# Patient Record
Sex: Male | Born: 1990 | Race: Black or African American | Hispanic: No | Marital: Single | Smoking: Current every day smoker
Health system: Southern US, Community
[De-identification: ages and names within clinical notes are randomized; demographics above are authoritative.]

## PROBLEM LIST (undated history)

## (undated) DIAGNOSIS — E119 Type 2 diabetes mellitus without complications: Secondary | ICD-10-CM

---
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
## 2013-06-15 ENCOUNTER — Emergency Department: Payer: Self-pay | Admitting: Emergency Medicine
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
## 2014-11-06 ENCOUNTER — Emergency Department: Payer: Self-pay | Admitting: Emergency Medicine

## 2014-12-15 ENCOUNTER — Emergency Department: Payer: Self-pay | Admitting: Emergency Medicine

## 2014-12-15 LAB — CBC
HCT: 46.2 % (ref 40.0–52.0)
HGB: 15.4 g/dL (ref 13.0–18.0)
MCH: 30.5 pg (ref 26.0–34.0)
MCHC: 33.4 g/dL (ref 32.0–36.0)
MCV: 91 fL (ref 80–100)
PLATELETS: 198 10*3/uL (ref 150–440)
RBC: 5.06 10*6/uL (ref 4.40–5.90)
RDW: 13.1 % (ref 11.5–14.5)
WBC: 5.5 10*3/uL (ref 3.8–10.6)

## 2014-12-15 LAB — URINALYSIS, COMPLETE
Bacteria: NONE SEEN
Bilirubin,UR: NEGATIVE
Blood: NEGATIVE
Glucose,UR: >=500 mg/dL (ref 0–75)
Leukocyte Esterase: NEGATIVE
NITRITE: NEGATIVE
PH: 5 (ref 4.5–8.0)
Protein: NEGATIVE
RBC,UR: NONE SEEN /HPF (ref 0–5)
Specific Gravity: 1.036 (ref 1.003–1.030)
Squamous Epithelial: NONE SEEN
WBC UR: <1 /HPF (ref 0–5)

## 2014-12-15 LAB — COMPREHENSIVE METABOLIC PANEL
Albumin: 3.9 g/dL (ref 3.4–5.0)
Alkaline Phosphatase: 81 U/L (ref 46–116)
Anion Gap: 9 (ref 7–16)
BUN: 9 mg/dL (ref 7–18)
Bilirubin,Total: 0.7 mg/dL (ref 0.2–1.0)
CHLORIDE: 100 mmol/L (ref 98–107)
CO2: 27 mmol/L (ref 21–32)
CREATININE: 0.68 mg/dL (ref 0.60–1.30)
Calcium, Total: 8.9 mg/dL (ref 8.5–10.1)
Glucose: 225 mg/dL — ABNORMAL HIGH (ref 65–99)
OSMOLALITY: 278 (ref 275–301)
Potassium: 4.4 mmol/L (ref 3.5–5.1)
SGOT(AST): 24 U/L (ref 15–37)
SGPT (ALT): 22 U/L (ref 14–63)
SODIUM: 136 mmol/L (ref 136–145)
Total Protein: 6.9 g/dL (ref 6.4–8.2)

## 2014-12-15 LAB — TROPONIN I: Troponin-I: <0.02 ng/mL

## 2014-12-16 LAB — HEMOGLOBIN A1C

## 2023-03-15 ENCOUNTER — Encounter: Payer: Self-pay | Admitting: Radiology

## 2023-03-15 ENCOUNTER — Emergency Department: Payer: 59

## 2023-03-15 ENCOUNTER — Other Ambulatory Visit: Payer: Self-pay

## 2023-03-15 DIAGNOSIS — E1110 Type 2 diabetes mellitus with ketoacidosis without coma: Secondary | ICD-10-CM | POA: Diagnosis not present

## 2023-03-15 DIAGNOSIS — R112 Nausea with vomiting, unspecified: Secondary | ICD-10-CM | POA: Diagnosis present

## 2023-03-15 DIAGNOSIS — E878 Other disorders of electrolyte and fluid balance, not elsewhere classified: Secondary | ICD-10-CM | POA: Diagnosis present

## 2023-03-15 DIAGNOSIS — R9431 Abnormal electrocardiogram [ECG] [EKG]: Secondary | ICD-10-CM | POA: Diagnosis present

## 2023-03-15 DIAGNOSIS — R197 Diarrhea, unspecified: Secondary | ICD-10-CM | POA: Diagnosis not present

## 2023-03-15 DIAGNOSIS — Y92009 Unspecified place in unspecified non-institutional (private) residence as the place of occurrence of the external cause: Secondary | ICD-10-CM

## 2023-03-15 DIAGNOSIS — N179 Acute kidney failure, unspecified: Secondary | ICD-10-CM | POA: Insufficient documentation

## 2023-03-15 DIAGNOSIS — E860 Dehydration: Secondary | ICD-10-CM | POA: Diagnosis present

## 2023-03-15 DIAGNOSIS — Z91148 Patient's other noncompliance with medication regimen for other reason: Secondary | ICD-10-CM

## 2023-03-15 DIAGNOSIS — T383X6A Underdosing of insulin and oral hypoglycemic [antidiabetic] drugs, initial encounter: Secondary | ICD-10-CM | POA: Diagnosis present

## 2023-03-15 LAB — COMPREHENSIVE METABOLIC PANEL
ALT: 22 U/L (ref 0–44)
AST: 29 U/L (ref 15–41)
Albumin: 5.2 g/dL — ABNORMAL HIGH (ref 3.5–5.0)
Alkaline Phosphatase: 72 U/L (ref 38–126)
Anion gap: 31 — ABNORMAL HIGH (ref 5–15)
BUN: 22 mg/dL — ABNORMAL HIGH (ref 6–20)
CO2: 13 mmol/L — ABNORMAL LOW (ref 22–32)
Calcium: 10.6 mg/dL — ABNORMAL HIGH (ref 8.9–10.3)
Chloride: 91 mmol/L — ABNORMAL LOW (ref 98–111)
Creatinine, Ser: 1.27 mg/dL — ABNORMAL HIGH (ref 0.61–1.24)
GFR, Estimated: >60 mL/min (ref >60)
Glucose, Bld: 410 mg/dL — ABNORMAL HIGH (ref 70–99)
Potassium: 3.6 mmol/L (ref 3.5–5.1)
Sodium: 135 mmol/L (ref 135–145)
Total Bilirubin: 2.1 mg/dL — ABNORMAL HIGH (ref 0.3–1.2)
Total Protein: 9.4 g/dL — ABNORMAL HIGH (ref 6.5–8.1)

## 2023-03-15 LAB — CBC
HCT: 51.1 % (ref 39.0–52.0)
Hemoglobin: 17.7 g/dL — ABNORMAL HIGH (ref 13.0–17.0)
MCH: 31.1 pg (ref 26.0–34.0)
MCHC: 34.6 g/dL (ref 30.0–36.0)
MCV: 89.8 fL (ref 80.0–100.0)
Platelets: 407 10*3/uL — ABNORMAL HIGH (ref 150–400)
RBC: 5.69 MIL/uL (ref 4.22–5.81)
RDW: 11.9 % (ref 11.5–15.5)
WBC: 14.1 10*3/uL — ABNORMAL HIGH (ref 4.0–10.5)
nRBC: 0 % (ref 0.0–0.2)

## 2023-03-15 LAB — LIPASE, BLOOD: Lipase: 23 U/L (ref 11–51)

## 2023-03-15 MED ORDER — ONDANSETRON 4 MG PO TBDP
4.0000 mg | ORAL_TABLET | Freq: Once | ORAL | Status: AC | PRN
  Administered 2023-03-15: 4 mg via ORAL
  Filled 2023-03-15: qty 1

## 2023-03-15 NOTE — ED Triage Notes (Signed)
Pt states he has been throwing up for the past 12 hours. Unable to keep anything down including water.Pt endorses loose stool x 2 but then just vomiting.

## 2023-03-16 ENCOUNTER — Observation Stay
Admission: EM | Admit: 2023-03-16 | Discharge: 2023-03-17 | Disposition: A | Discharge status: Home / Self Care | Payer: 59 | Attending: Internal Medicine | Admitting: Internal Medicine

## 2023-03-16 ENCOUNTER — Other Ambulatory Visit: Payer: Self-pay

## 2023-03-16 DIAGNOSIS — R1114 Bilious vomiting: Secondary | ICD-10-CM

## 2023-03-16 DIAGNOSIS — T383X6A Underdosing of insulin and oral hypoglycemic [antidiabetic] drugs, initial encounter: Secondary | ICD-10-CM | POA: Diagnosis present

## 2023-03-16 DIAGNOSIS — R112 Nausea with vomiting, unspecified: Principal | ICD-10-CM | POA: Diagnosis present

## 2023-03-16 DIAGNOSIS — R9431 Abnormal electrocardiogram [ECG] [EKG]: Secondary | ICD-10-CM | POA: Diagnosis present

## 2023-03-16 DIAGNOSIS — E1110 Type 2 diabetes mellitus with ketoacidosis without coma: Secondary | ICD-10-CM | POA: Diagnosis present

## 2023-03-16 DIAGNOSIS — E1310 Other specified diabetes mellitus with ketoacidosis without coma: Secondary | ICD-10-CM

## 2023-03-16 DIAGNOSIS — N179 Acute kidney failure, unspecified: Secondary | ICD-10-CM | POA: Diagnosis present

## 2023-03-16 DIAGNOSIS — E878 Other disorders of electrolyte and fluid balance, not elsewhere classified: Secondary | ICD-10-CM | POA: Diagnosis present

## 2023-03-16 DIAGNOSIS — E860 Dehydration: Secondary | ICD-10-CM | POA: Diagnosis present

## 2023-03-16 DIAGNOSIS — Y92009 Unspecified place in unspecified non-institutional (private) residence as the place of occurrence of the external cause: Secondary | ICD-10-CM | POA: Diagnosis not present

## 2023-03-16 DIAGNOSIS — Z91148 Patient's other noncompliance with medication regimen for other reason: Secondary | ICD-10-CM | POA: Diagnosis not present

## 2023-03-16 HISTORY — DX: Type 2 diabetes mellitus without complications: E11.9

## 2023-03-16 LAB — BASIC METABOLIC PANEL
Anion gap: 19 — ABNORMAL HIGH (ref 5–15)
Anion gap: 14 (ref 5–15)
Anion gap: 10 (ref 5–15)
BUN: 25 mg/dL — ABNORMAL HIGH (ref 6–20)
BUN: 19 mg/dL (ref 6–20)
BUN: 21 mg/dL — ABNORMAL HIGH (ref 6–20)
CO2: 21 mmol/L — ABNORMAL LOW (ref 22–32)
CO2: 25 mmol/L (ref 22–32)
CO2: 26 mmol/L (ref 22–32)
Calcium: 10.1 mg/dL (ref 8.9–10.3)
Calcium: 9.9 mg/dL (ref 8.9–10.3)
Calcium: 9.2 mg/dL (ref 8.9–10.3)
Chloride: 93 mmol/L — ABNORMAL LOW (ref 98–111)
Chloride: 96 mmol/L — ABNORMAL LOW (ref 98–111)
Chloride: 100 mmol/L (ref 98–111)
Creatinine, Ser: 0.97 mg/dL (ref 0.61–1.24)
Creatinine, Ser: 1.04 mg/dL (ref 0.61–1.24)
Creatinine, Ser: 0.7 mg/dL (ref 0.61–1.24)
GFR, Estimated: >60 mL/min (ref >60)
GFR, Estimated: >60 mL/min (ref >60)
GFR, Estimated: >60 mL/min (ref >60)
Glucose, Bld: 263 mg/dL — ABNORMAL HIGH (ref 70–99)
Glucose, Bld: 230 mg/dL — ABNORMAL HIGH (ref 70–99)
Glucose, Bld: 174 mg/dL — ABNORMAL HIGH (ref 70–99)
Potassium: 3.1 mmol/L — ABNORMAL LOW (ref 3.5–5.1)
Potassium: 3.5 mmol/L (ref 3.5–5.1)
Potassium: 3.6 mmol/L (ref 3.5–5.1)
Sodium: 133 mmol/L — ABNORMAL LOW (ref 135–145)
Sodium: 135 mmol/L (ref 135–145)
Sodium: 136 mmol/L (ref 135–145)

## 2023-03-16 LAB — LIPID PANEL
Cholesterol: 249 mg/dL — ABNORMAL HIGH (ref 0–200)
HDL: 73 mg/dL (ref >40)
LDL Cholesterol: 147 mg/dL — ABNORMAL HIGH (ref 0–99)
Total CHOL/HDL Ratio: 3.4 RATIO
Triglycerides: 143 mg/dL (ref <150)
VLDL: 29 mg/dL (ref 0–40)

## 2023-03-16 LAB — GLUCOSE, CAPILLARY
Glucose-Capillary: 377 mg/dL — ABNORMAL HIGH (ref 70–99)
Glucose-Capillary: 292 mg/dL — ABNORMAL HIGH (ref 70–99)
Glucose-Capillary: 262 mg/dL — ABNORMAL HIGH (ref 70–99)
Glucose-Capillary: 208 mg/dL — ABNORMAL HIGH (ref 70–99)
Glucose-Capillary: 209 mg/dL — ABNORMAL HIGH (ref 70–99)
Glucose-Capillary: 226 mg/dL — ABNORMAL HIGH (ref 70–99)
Glucose-Capillary: 211 mg/dL — ABNORMAL HIGH (ref 70–99)
Glucose-Capillary: 182 mg/dL — ABNORMAL HIGH (ref 70–99)
Glucose-Capillary: 203 mg/dL — ABNORMAL HIGH (ref 70–99)
Glucose-Capillary: 142 mg/dL — ABNORMAL HIGH (ref 70–99)
Glucose-Capillary: 179 mg/dL — ABNORMAL HIGH (ref 70–99)
Glucose-Capillary: 169 mg/dL — ABNORMAL HIGH (ref 70–99)
Glucose-Capillary: 134 mg/dL — ABNORMAL HIGH (ref 70–99)
Glucose-Capillary: 163 mg/dL — ABNORMAL HIGH (ref 70–99)

## 2023-03-16 LAB — BETA-HYDROXYBUTYRIC ACID
Beta-Hydroxybutyric Acid: 3.69 mmol/L — ABNORMAL HIGH (ref 0.05–0.27)
Beta-Hydroxybutyric Acid: 2.44 mmol/L — ABNORMAL HIGH (ref 0.05–0.27)
Beta-Hydroxybutyric Acid: 0.85 mmol/L — ABNORMAL HIGH (ref 0.05–0.27)

## 2023-03-16 LAB — HIV ANTIBODY (ROUTINE TESTING W REFLEX): HIV Screen 4th Generation wRfx: NONREACTIVE

## 2023-03-16 LAB — BLOOD GAS, VENOUS
Acid-Base Excess: 0.9 mmol/L (ref 0.0–2.0)
Bicarbonate: 23.8 mmol/L (ref 20.0–28.0)
O2 Saturation: 93.3 %
Patient temperature: 37
pCO2, Ven: 32 mmHg — ABNORMAL LOW (ref 44–60)
pH, Ven: 7.48 — ABNORMAL HIGH (ref 7.25–7.43)
pO2, Ven: 58 mmHg — ABNORMAL HIGH (ref 32–45)

## 2023-03-16 LAB — CK: Total CK: 79 U/L (ref 49–397)

## 2023-03-16 LAB — CBG MONITORING, ED: Glucose-Capillary: >600 mg/dL (ref 70–99)

## 2023-03-16 LAB — MAGNESIUM: Magnesium: 2.1 mg/dL (ref 1.7–2.4)

## 2023-03-16 MED ORDER — HEPARIN SODIUM (PORCINE) 5000 UNIT/ML IJ SOLN
5000.0000 [IU] | Freq: Three times a day (TID) | INTRAMUSCULAR | Status: DC
  Administered 2023-03-16 – 2023-03-17 (×4): 5000 [IU] via SUBCUTANEOUS
  Filled 2023-03-16 (×4): qty 1

## 2023-03-16 MED ORDER — SODIUM CHLORIDE 0.9 % IV SOLN: 12.5000 mg | Freq: Four times a day (QID) | INTRAVENOUS | Status: DC | PRN

## 2023-03-16 MED ORDER — LACTATED RINGERS IV SOLN: INTRAVENOUS | Status: DC

## 2023-03-16 MED ORDER — SODIUM CHLORIDE 0.9 % IV BOLUS: 1000.0000 mL | Freq: Once | INTRAVENOUS | Status: DC

## 2023-03-16 MED ORDER — DEXTROSE-NACL 5-0.45 % IV SOLN: INTRAVENOUS | Status: DC

## 2023-03-16 MED ORDER — SODIUM CHLORIDE 0.9 % IV BOLUS
2000.0000 mL | Freq: Once | INTRAVENOUS | Status: AC
  Administered 2023-03-16: 2000 mL via INTRAVENOUS

## 2023-03-16 MED ORDER — INSULIN REGULAR(HUMAN) IN NACL 100-0.9 UT/100ML-% IV SOLN
INTRAVENOUS | Status: DC
  Filled 2023-03-16: qty 100

## 2023-03-16 MED ORDER — POTASSIUM CHLORIDE 10 MEQ/100ML IV SOLN
10.0000 meq | INTRAVENOUS | Status: AC
  Administered 2023-03-16 (×6): 10 meq via INTRAVENOUS
  Filled 2023-03-16 (×6): qty 100

## 2023-03-16 MED ORDER — DEXTROSE 50 % IV SOLN: 0.0000 mL | INTRAVENOUS | Status: DC | PRN

## 2023-03-16 MED ORDER — SODIUM CHLORIDE 0.9 % IV SOLN
INTRAVENOUS | Status: DC
  Filled 2023-03-16 (×2): qty 1000

## 2023-03-16 MED ORDER — DEXTROSE IN LACTATED RINGERS 5 % IV SOLN: INTRAVENOUS | Status: DC

## 2023-03-16 MED ORDER — ONDANSETRON HCL 4 MG/2ML IJ SOLN
4.0000 mg | Freq: Four times a day (QID) | INTRAMUSCULAR | Status: DC | PRN
  Administered 2023-03-16 – 2023-03-17 (×2): 4 mg via INTRAVENOUS
  Filled 2023-03-16 (×3): qty 2

## 2023-03-16 MED ORDER — SODIUM CHLORIDE 0.9 % IV SOLN: INTRAVENOUS | Status: AC

## 2023-03-16 MED ORDER — ONDANSETRON HCL 4 MG/2ML IJ SOLN
4.0000 mg | Freq: Once | INTRAMUSCULAR | Status: AC
  Administered 2023-03-16: 4 mg via INTRAVENOUS
  Filled 2023-03-16: qty 2

## 2023-03-16 MED ORDER — PANTOPRAZOLE SODIUM 40 MG IV SOLR
40.0000 mg | Freq: Two times a day (BID) | INTRAVENOUS | Status: DC
  Administered 2023-03-16 – 2023-03-17 (×3): 40 mg via INTRAVENOUS
  Filled 2023-03-16 (×3): qty 10

## 2023-03-16 MED ORDER — ACETAMINOPHEN 325 MG PO TABS: 650.0000 mg | ORAL_TABLET | Freq: Four times a day (QID) | ORAL | Status: DC | PRN

## 2023-03-16 MED ORDER — CHLORHEXIDINE GLUCONATE CLOTH 2 % EX PADS
6.0000 | MEDICATED_PAD | Freq: Every day | CUTANEOUS | Status: DC
  Administered 2023-03-17: 6 via TOPICAL

## 2023-03-16 MED ORDER — DEXTROSE 50 % IV SOLN
0.0000 mL | INTRAVENOUS | Status: DC | PRN
  Filled 2023-03-16: qty 50

## 2023-03-16 MED ORDER — ONDANSETRON HCL 4 MG/2ML IJ SOLN
4.0000 mg | Freq: Four times a day (QID) | INTRAMUSCULAR | Status: DC | PRN
  Administered 2023-03-16: 4 mg via INTRAVENOUS
  Filled 2023-03-16: qty 2

## 2023-03-16 MED ORDER — INSULIN ASPART 100 UNIT/ML IJ SOLN
0.0000 [IU] | INTRAMUSCULAR | Status: DC
  Administered 2023-03-16: 3 [IU] via SUBCUTANEOUS
  Administered 2023-03-16: 2 [IU] via SUBCUTANEOUS
  Administered 2023-03-17 (×2): 3 [IU] via SUBCUTANEOUS
  Administered 2023-03-17: 2 [IU] via SUBCUTANEOUS
  Administered 2023-03-17: 8 [IU] via SUBCUTANEOUS
  Filled 2023-03-16 (×6): qty 1

## 2023-03-16 MED ORDER — POTASSIUM CHLORIDE 10 MEQ/100ML IV SOLN
10.0000 meq | INTRAVENOUS | Status: AC
  Administered 2023-03-16: 10 meq via INTRAVENOUS
  Filled 2023-03-16: qty 100

## 2023-03-16 MED ORDER — INSULIN GLARGINE-YFGN 100 UNIT/ML ~~LOC~~ SOLN
10.0000 [IU] | SUBCUTANEOUS | Status: DC
  Administered 2023-03-16: 10 [IU] via SUBCUTANEOUS
  Filled 2023-03-16 (×2): qty 0.1

## 2023-03-16 MED ORDER — METOCLOPRAMIDE HCL 5 MG/ML IJ SOLN
10.0000 mg | Freq: Three times a day (TID) | INTRAMUSCULAR | Status: DC
  Administered 2023-03-16 – 2023-03-17 (×4): 10 mg via INTRAVENOUS
  Filled 2023-03-16 (×4): qty 2

## 2023-03-16 MED ORDER — INSULIN REGULAR(HUMAN) IN NACL 100-0.9 UT/100ML-% IV SOLN
INTRAVENOUS | Status: DC
  Administered 2023-03-16: 9 [IU]/h via INTRAVENOUS

## 2023-03-16 NOTE — ED Provider Notes (Signed)
[facility] Provider Note    Event Date/Time   First MD Initiated Contact with Patient 03/16/23 [phone]     (approximate)   History   Emesis   HPI  Vincent Morgan is a 32 y.o. male   Past medical history of diabetes on insulin who presents the emergency room with nausea and vomiting and some diarrhea over the last several days.  Abdominal cramping pain.  Fatigue.  Has not been taking his insulin for 3 months because he thinks it does not work well for him and causes cramping pain in his legs.  He is unknown whether he is type I or type II diabetic and there are no medical records to clarify, his mother is at bedside and does not know as well but knows that he was diagnosed in his 77s.  He denies fever or chills.  No urinary symptoms.  No GI bleeding.  Independent Historian contributed to assessment above: His mother is at bedside corroborates information given above     Physical Exam   Triage Vital Signs: ED Triage Vitals  Enc Vitals Group     BP 03/15/23 2146 125/88     Pulse Rate 03/15/23 2146 (!) 122     Resp 03/15/23 2146 20     Temp 03/15/23 2146 98.9 F (37.2 C)     Temp Source 03/15/23 2146 Oral     SpO2 03/15/23 2146 100 %     Weight 03/15/23 2147 140 lb (63.5 kg)     Height 03/15/23 2147 6\' 2"  (1.88 m)     Head Circumference --      Peak Flow --      Pain Score --      Pain Loc --      Pain Edu? --      Excl. in GC? --     Most recent vital signs: Vitals:   03/15/23 2146  BP: 125/88  Pulse: (!) 122  Resp: 20  Temp: 98.9 F (37.2 C)  SpO2: 100%    General: Awake, no distress.  CV:  Good peripheral perfusion.  Resp:  Normal effort.  Abd:  No distention.  Other:  He is dry heaving at bedside with a soft nontender abdomen appears dehydrated with dry mucous membranes.  He is tachycardic   ED Results / Procedures / Treatments   Labs (all labs ordered are listed, but only abnormal results are displayed) Labs Reviewed   COMPREHENSIVE METABOLIC PANEL - Abnormal; Notable for the following components:      Result Value   Chloride 91 (*)    CO2 13 (*)    Glucose, Bld 410 (*)    BUN 22 (*)    Creatinine, Ser 1.27 (*)    Calcium 10.6 (*)    Total Protein 9.4 (*)    Albumin 5.2 (*)    Total Bilirubin 2.1 (*)    Anion gap 31 (*)    All other components within normal limits  CBC - Abnormal; Notable for the following components:   WBC 14.1 (*)    Hemoglobin 17.7 (*)    Platelets 407 (*)    All other components within normal limits  LIPASE, BLOOD  URINALYSIS, ROUTINE W REFLEX MICROSCOPIC  BLOOD GAS, VENOUS  BETA-HYDROXYBUTYRIC ACID  BASIC METABOLIC PANEL  BASIC METABOLIC PANEL  BASIC METABOLIC PANEL  BASIC METABOLIC PANEL  BASIC METABOLIC PANEL  CBG MONITORING, ED     I ordered and reviewed the above labs they  are notable for he has a blood glucose in the 400s and an elevated anion gap in the 30s  RADIOLOGY I independently reviewed and interpreted CT scan abdomen pelvis see no obvious inflammatory obstructive patterns   PROCEDURES:  Critical Care performed: Yes, see critical care procedure note(s)  .Critical Care  Performed by: Pilar Jarvis, MD Authorized by: Pilar Jarvis, MD   Critical care provider statement:    Critical care time (minutes):  30   Critical care was necessary to treat or prevent imminent or life-threatening deterioration of the following conditions:  Endocrine crisis   Critical care was time spent personally by me on the following activities:  Development of treatment plan with patient or surrogate, discussions with consultants, evaluation of patient's response to treatment, examination of patient, ordering and review of laboratory studies, ordering and review of radiographic studies, ordering and performing treatments and interventions, pulse oximetry, re-evaluation of patient's condition and review of old charts    MEDICATIONS ORDERED IN ED: Medications  ondansetron  (ZOFRAN) injection 4 mg (has no administration in time range)  sodium chloride 0.9 % bolus 2,000 mL (has no administration in time range)  insulin regular, human (MYXREDLIN) 100 units/ 100 mL infusion (has no administration in time range)  dextrose 5 % in lactated ringers infusion (has no administration in time range)  dextrose 50 % solution 0-50 mL (has no administration in time range)  potassium chloride 10 mEq in 100 mL IVPB (has no administration in time range)  sodium chloride 0.9 % 1,000 mL with potassium chloride 10 mEq infusion (has no administration in time range)  ondansetron (ZOFRAN-ODT) disintegrating tablet 4 mg (4 mg Oral Given 03/15/23 2155)    External physician / consultants:  I spoke with hospitalist for admission and regarding care plan for this patient.   IMPRESSION / MDM / ASSESSMENT AND PLAN / ED COURSE  I reviewed the triage vital signs and the nursing notes.                                Patient's presentation is most consistent with acute presentation with potential threat to life or bodily function.  Differential diagnosis includes, but is not limited to, DKA, dehydration, electrolyte derangement, viral gastroenteritis, colitis, intra-abdominal infection/obstruction   The patient is on the cardiac monitor to evaluate for evidence of arrhythmia and/or significant heart rate changes.  MDM: Patient insulin-dependent diabetic whose labs suggest he is in DKA with symptoms consistent with the same in the setting of medication noncompliance.  CT scan shows no emergent surgical pathologies.  He looks dehydrated.  Will start on DKA protocol and admit.       FINAL CLINICAL IMPRESSION(S) / ED DIAGNOSES   Final diagnoses:  Nausea vomiting and diarrhea  Diabetic ketoacidosis without coma associated with other specified diabetes mellitus (HCC)     Rx / DC Orders   ED Discharge Orders     None        Note:  This document was prepared using Dragon voice  recognition software and may include unintentional dictation errors.    Pilar Jarvis, MD 03/16/23 [phone]

## 2023-03-16 NOTE — Assessment & Plan Note (Addendum)
-  Patient with poor baseline control -A1c pending.  -No indication of illness as source -Otherwise no apparent reason for DKA -Moderate DKA suspected venous blood gas pending. -Will admit to SDU with DKA protocol -Would recommend continuing insulin drip at least until morning regardless of rapidity of closure of gap and normalization of labs -K+ slightly increased at time of presentation but lowered to 5 quickly and so potassium supplementation added -IVF at 150 cc/hr, LR until glucose <250 and then decrease rate to 125 and change to D5LR -Patient to continue the insulin gtt.

## 2023-03-16 NOTE — H&P (Signed)
History and Physical     Patient: Vincent Morgan WGN:[id number] DOB: 02-09-1991 DOA: 03/16/2023 DOS: the patient was seen and examined on 03/16/2023 PCP: Patient, No Pcp Per   Patient coming from: Home  Chief Complaint: Nausea vomiting abdominal pain  HISTORY OF PRESENT ILLNESS: Vincent Morgan is an 32 y.o. male presenting with complaints of nausea vomiting and abdominal pain for the past day or so unable to keep anything down including water patient feels episodes and significant weight loss over the past few months.  There is no history of drugs or alcohol .  Patient does not know if he is type I or type II diabetic but was diagnosed recently has not been taking his insulin for the past few months.  Patient just reports that leg cramps I assessed the patient in the emergency room he is vomiting clear bag mom is at bedside patient examined clinically and admitted to stepdown to start DKA protocol and insulin drip and correct electrolytes and additional orders.   Past Medical History:  Diagnosis Date   Diabetes mellitus without complication (HCC)    Review of Systems  Constitutional:  Positive for fatigue and unexpected weight change.  Gastrointestinal:  Positive for nausea and vomiting.   No Known Allergies History reviewed. No pertinent surgical history. MEDICATIONS: Prior to Admission medications   Not on File    heparin  5,000 Units Subcutaneous Q8H   pantoprazole (PROTONIX) IV  40 mg Intravenous Q12H     sodium chloride 150 mL/hr at 03/16/23 0417   dextrose 5 % and 0.45% NaCl     insulin 9 Units/hr (03/16/23 0219)   ED Course: Pt in Ed patient is vomiting and ill-appearing although alert and awake.  Meeting SIRS criteria. Vitals:   03/15/23 2146 03/15/23 2147 03/16/23 0130 03/16/23 0300  BP: 125/88  126/83 109/80  Pulse: (!) 122  (!) 118 98  Resp: 20   18  Temp: 98.9 F (37.2 C)   98 F (36.7 C)  TempSrc: Oral   Oral  SpO2: 100%  100% 100%  Weight:  63.5 kg     Height:  6\' 2"  (1.88 m)     Total I/O In: 90.9 [I.V.:0.1; IV Piggyback:90.8] Out: -  SpO2: 100 % Blood work in ed shows: CMP shows serum bicarb of 13 anion gap of 31 calcium of 10.61.27 which is new, total bili of 2.1 and leukocytosis of 14.1. Patient had a CT of the abdomen and pelvis in the emergency room which was limited due to the noncontrast due to AKI but was negative for any acute intra abdominal or intrapelvic abnormality.  Results for orders placed or performed during the hospital encounter of 03/16/23 (from the past 72 hour(s))  Lipase, blood     Status: None   Collection Time: 03/15/23  9:55 PM  Result Value Ref Range   Lipase 23 11 - 51 U/L    Comment: Performed at [facility], [address] [place], [place] [address]  Comprehensive metabolic panel     Status: Abnormal   Collection Time: 03/15/23  9:55 PM  Result Value Ref Range   Sodium 135 135 - 145 mmol/L   Potassium 3.6 3.5 - 5.1 mmol/L   Chloride 91 (L) 98 - 111 mmol/L   CO2 13 (L) 22 - 32 mmol/L   Glucose, Bld 410 (H) 70 - 99 mg/dL    Comment: Glucose reference range applies only to samples taken after fasting for at least 8 hours.  BUN 22 (H) 6 - 20 mg/dL   Creatinine, Ser 1.61 (H) 0.61 - 1.24 mg/dL   Calcium 09.6 (H) 8.9 - 10.3 mg/dL   Total Protein 9.4 (H) 6.5 - 8.1 g/dL   Albumin 5.2 (H) 3.5 - 5.0 g/dL   AST 29 15 - 41 U/L   ALT 22 0 - 44 U/L   Alkaline Phosphatase 72 38 - 126 U/L   Total Bilirubin 2.1 (H) 0.3 - 1.2 mg/dL   GFR, Estimated >04 >54 mL/min    Comment: (NOTE) Calculated using the CKD-EPI Creatinine Equation (2021)    Anion gap 31 (H) 5 - 15    Comment: Performed at [facility], [address] [place], [place] [address]  CBC     Status: Abnormal   Collection Time: 03/15/23  9:55 PM  Result Value Ref Range   WBC 14.1 (H) 4.0 - 10.5 K/uL   RBC 5.69 4.22 - 5.81 MIL/uL   Hemoglobin 17.7 (H) 13.0 - 17.0 g/dL   HCT 91.4 78.2 - 95.6 %   MCV 89.8 80.0 - 100.0 fL    MCH 31.1 26.0 - 34.0 pg   MCHC 34.6 30.0 - 36.0 g/dL   RDW 21.3 08.6 - 57.8 %   Platelets 407 (H) 150 - 400 K/uL   nRBC 0.0 0.0 - 0.2 %    Comment: Performed at [facility], [address] [place], [place] [address]  CBG monitoring, ED     Status: Abnormal   Collection Time: 03/16/23  2:15 AM  Result Value Ref Range   Glucose-Capillary >600 (HH) 70 - 99 mg/dL    Comment: Glucose reference range applies only to samples taken after fasting for at least 8 hours.    Lab Results  Component Value Date   CREATININE 1.27 (H) 03/15/2023   CREATININE 0.68 12/15/2014      Latest Ref Rng & Units 03/15/2023    9:55 PM 12/15/2014    7:39 AM  CMP  Glucose 70 - 99 mg/dL 952  841   BUN 6 - 20 mg/dL 22  9   Creatinine 3.24 - 1.24 mg/dL 4.01  0.27   Sodium 253 - 145 mmol/L 135  136   Potassium 3.5 - 5.1 mmol/L 3.6  4.4   Chloride 98 - 111 mmol/L 91  100   CO2 22 - 32 mmol/L 13  27   Calcium 8.9 - 10.3 mg/dL 66.4  8.9   Total Protein 6.5 - 8.1 g/dL 9.4  6.9   Total Bilirubin 0.3 - 1.2 mg/dL 2.1  0.7   Alkaline Phos 38 - 126 U/L 72  81   AST 15 - 41 U/L 29  24   ALT 0 - 44 U/L 22  22    Unresulted Labs (From admission, onward)     Start     Ordered   03/16/23 0338  Magnesium  ONCE - STAT,   STAT        03/16/23 0337   03/16/23 0338  Blood gas, venous  ONCE - STAT,   STAT        03/16/23 0338   03/16/23 0336  Basic metabolic panel  ONCE - STAT,   STAT        03/16/23 0335   03/16/23 0217  Lipid panel  Once,   URGENT        03/16/23 0217   03/16/23 0217  CK  Once,   URGENT  03/16/23 0217   03/16/23 0214  HIV Antibody (routine testing w rflx)  (HIV Antibody (Routine testing w reflex) panel)  Once,   URGENT        03/16/23 0217   03/16/23 0214  Basic metabolic panel  (Diabetes Ketoacidosis (DKA))  STAT Now then every 4 hours ,   STAT      03/16/23 0217   03/16/23 0214  Beta-hydroxybutyric acid  (Diabetes Ketoacidosis (DKA))  Now then every 8 hours,   URGENT      03/16/23  0217   03/16/23 0214  Hemoglobin A1c  (Diabetes Ketoacidosis (DKA))  Once,   URGENT       Comments: To assess prior glycemic control.    03/16/23 0217   03/16/23 0110  Beta-hydroxybutyric acid  Once,   URGENT        03/16/23 0109   03/15/23 2148  Urinalysis, Routine w reflex microscopic -Urine, Clean Catch  Once,   URGENT       Question:  Specimen Source  Answer:  Urine, Clean Catch   03/15/23 2148           Pt has received : Orders Placed This Encounter  Procedures   Critical Care    This order was created via procedure documentation    Standing Status:   Standing    Number of Occurrences:   1   CT ABDOMEN PELVIS WO CONTRAST    Standing Status:   Standing    Number of Occurrences:   1    Order Specific Question:   If indicated for the ordered procedure, I authorize the administration of oral contrast media per Radiology protocol    Answer:   Yes    Order Specific Question:   Does the patient have a contrast media/X-ray dye allergy?    Answer:   No   Lipase, blood    Standing Status:   Standing    Number of Occurrences:   1   Comprehensive metabolic panel    Standing Status:   Standing    Number of Occurrences:   1   CBC    Standing Status:   Standing    Number of Occurrences:   1   Urinalysis, Routine w reflex microscopic -Urine, Clean Catch    Standing Status:   Standing    Number of Occurrences:   1    Order Specific Question:   Specimen Source    Answer:   Urine, Clean Catch [76]   Beta-hydroxybutyric acid    Standing Status:   Standing    Number of Occurrences:   1   HIV Antibody (routine testing w rflx)    Standing Status:   Standing    Number of Occurrences:   1   Basic metabolic panel    Standing Status:   Standing    Number of Occurrences:   5   Beta-hydroxybutyric acid    Standing Status:   Standing    Number of Occurrences:   3   Hemoglobin A1c    To assess prior glycemic control.    Standing Status:   Standing    Number of Occurrences:   1    Lipid panel    Standing Status:   Standing    Number of Occurrences:   1   CK    Standing Status:   Standing    Number of Occurrences:   1   Basic metabolic panel    Standing Status:   Standing  Number of Occurrences:   1   Magnesium    Standing Status:   Standing    Number of Occurrences:   1   Blood gas, venous    Standing Status:   Standing    Number of Occurrences:   1   Diet NPO time specified Except for: Sips with Meds    Standing Status:   Standing    Number of Occurrences:   1    Order Specific Question:   Except for    Answer:   Sips with Meds   ED Cardiac monitoring    Standing Status:   Standing    Number of Occurrences:   1   Initiate Carrier Fluid Protocol    Standing Status:   Standing    Number of Occurrences:   1   Notify physician (specify)    Standing Status:   Standing    Number of Occurrences:   1    Order Specific Question:   Notify physician for    Answer:   K+ <3.5 mmol/L    Order Specific Question:   Notify physician for    Answer:   Sudden headache    Order Specific Question:   Notify physician for    Answer:   Change in LOC    Order Specific Question:   Notify physician    Answer:   As prompted by EndoTool    Order Specific Question:   Notify physician for    Answer:   All abnormal BMET and Beta-Hydroxybutyrate Acid results   If present, discontinue Insulin Pump after IV Insulin is initiated.    Standing Status:   Standing    Number of Occurrences:   1   Do NOT use lab glucose values in EndoTool.  If CBG meter reads "Critical High", enter 600.    Standing Status:   Standing    Number of Occurrences:   1   IV insulin infusion with sufficient glucose should be continued until MD determines acidosis is corrected and places transition orders.    Standing Status:   Standing    Number of Occurrences:   1   Upon IV fluid bolus completion, place order for STAT BMET (LAB15) and call provider with results.    Standing Status:   Standing    Number of  Occurrences:   1   Vital signs    Or more frequently per unit routine.    Standing Status:   Standing    Number of Occurrences:   1   Cardiac monitoring    Standing Status:   Standing    Number of Occurrences:   1   Progressive Mobility Protocol: No Restrictions    Standing Status:   Standing    Number of Occurrences:   1   Apply Diabetes Mellitus Care Plan    Standing Status:   Standing    Number of Occurrences:   1   Apply Diabetic Ketoacidosis Care Plan    Standing Status:   Standing    Number of Occurrences:   1   Notify physician (specify)    Standing Status:   Standing    Number of Occurrences:   1    Order Specific Question:   Notify physician for    Answer:   Pulse less than 60 or greater than 120    Order Specific Question:   Notify physician for    Answer:   Respiratory rate less than 12 or greater than 25  Order Specific Question:   Notify physician for    Answer:   Temperature greater than 38.5C (101.73F)    Order Specific Question:   Notify physician for    Answer:   Urine output less than 30 ml/hr for four hours    Order Specific Question:   Notify physician for    Answer:   Systolic BP less than 90 or greater than 140; Diastolic BP less than 60 or greater than 90   If present, discontinue Insulin Pump after IV Insulin is initiated.    Standing Status:   Standing    Number of Occurrences:   1   Do NOT use lab glucose values in EndoTool.  If CBG meter reads "Critical High", enter 600.    Standing Status:   Standing    Number of Occurrences:   1   Refer to Hyperglycemic Crisis (DKA, HHS, Hyperglycemia) sidebar report    Standing Status:   Standing    Number of Occurrences:   1   Strict intake and output    Standing Status:   Standing    Number of Occurrences:   1   Initiate Oral Care Protocol    Standing Status:   Standing    Number of Occurrences:   1   Initiate Carrier Fluid Protocol    Standing Status:   Standing    Number of Occurrences:   1   RN may  order General Admission PRN Orders utilizing "General Admission PRN medications" (through manage orders) for the following patient needs: allergy symptoms (Claritin), cold sores (Carmex), cough (Robitussin DM), eye irritation (Liquifilm Tears), hemorrhoids (Tucks), indigestion (Maalox), minor skin irritation (Hydrocortisone Cream), muscle pain Romeo Apple Gay), nose irritation (saline nasal spray) and sore throat (Chloraseptic spray).    Standing Status:   Standing    Number of Occurrences:   [phone]   Notify physician (specify)    Standing Status:   Standing    Number of Occurrences:   1    Order Specific Question:   Notify physician    Answer:   As prompted by EndoTool    Order Specific Question:   Notify physician for    Answer:   All abnormal BMET and Beta-Hydroxybutyrate Acid results    Order Specific Question:   Notify physician for    Answer:   K+ <3.5 mmol/L    Order Specific Question:   Notify physician for    Answer:   Sudden headache    Order Specific Question:   Notify physician for    Answer:   Change in LOC   Upon IV fluid bolus completion, place order for STAT BMET (LAB15) and call provider with results.    Standing Status:   Standing    Number of Occurrences:   1   IV insulin infusion with sufficient glucose should be continued until MD determines acidosis is corrected and places transition orders.    Standing Status:   Standing    Number of Occurrences:   1   IV bolus already initiated    Standing Status:   Standing    Number of Occurrences:   1   K+ > 5 mEq/L and/or K+ addressed separately    Standing Status:   Standing    Number of Occurrences:   1   Full code    Standing Status:   Standing    Number of Occurrences:   1    Order Specific Question:   By:    Answer:  Other   Consult to hospitalist    Standing Status:   Standing    Number of Occurrences:   1    Order Specific Question:   Place call to:    Answer:   [id number]    Order Specific Question:   Reason for Consult     Answer:   Admit   Consult to diabetes coordinator    Standing Status:   Standing    Number of Occurrences:   1    Order Specific Question:   Reason for Consult?    Answer:   DM management evaluation   CBG monitoring, ED    Standing Status:   Standing    Number of Occurrences:   1   CBG monitoring, ED    As directed by EndoTool.    Standing Status:   Standing    Number of Occurrences:   [phone]   ED EKG    Standing Status:   Standing    Number of Occurrences:   1    Order Specific Question:   Reason for Exam    Answer:   Other (See Comments)   Insert peripheral IV    Standing Status:   Standing    Number of Occurrences:   1   Admit to Inpatient (patient's expected length of stay will be greater than 2 midnights or inpatient only procedure)    Standing Status:   Standing    Number of Occurrences:   1    Order Specific Question:   Hospital Area    Answer:   [facility] [[id number]]    Order Specific Question:   Level of Care    Answer:   Stepdown [14]    Order Specific Question:   Covid Evaluation    Answer:   Asymptomatic - no recent exposure (last 10 days) testing not required    Order Specific Question:   Diagnosis    Answer:   Nausea and vomiting [744752]    Order Specific Question:   Admitting Physician    Answer:   Darrold Junker    Order Specific Question:   Attending Physician    Answer:   Darrold Junker    Order Specific Question:   Certification:    Answer:   I certify this patient will need inpatient services for at least 2 midnights    Order Specific Question:   Estimated Length of Stay    Answer:   2    Meds ordered this encounter  Medications   ondansetron (ZOFRAN-ODT) disintegrating tablet 4 mg   DISCONTD: sodium chloride 0.9 % bolus 1,000 mL   ondansetron (ZOFRAN) injection 4 mg   sodium chloride 0.9 % bolus 2,000 mL   DISCONTD: insulin regular, human (MYXREDLIN) 100 units/ 100 mL infusion    Order Specific Question:    EndoTool low target:    Answer:   140    Order Specific Question:   EndoTool high target:    Answer:   180    Order Specific Question:   Type of Diabetes    Answer:   Unknown    Order Specific Question:   Mode of Therapy    Answer:   ENDOX1 for DKA    Order Specific Question:   Start Method    Answer:   EndoTool to calculate   DISCONTD: dextrose 5 % in lactated ringers infusion   DISCONTD: dextrose 50 % solution 0-50 mL   potassium chloride 10  mEq in 100 mL IVPB   DISCONTD: sodium chloride 0.9 % 1,000 mL with potassium chloride 10 mEq infusion   heparin injection 5,000 Units   insulin regular, human (MYXREDLIN) 100 units/ 100 mL infusion    Order Specific Question:   EndoTool low target:    Answer:   140    Order Specific Question:   EndoTool high target:    Answer:   180    Order Specific Question:   Type of Diabetes    Answer:   Unknown    Order Specific Question:   Mode of Therapy    Answer:   ENDOX1 for DKA    Order Specific Question:   Start Method    Answer:   EndoTool to calculate   dextrose 50 % solution 0-50 mL   dextrose 5 %-0.45 % sodium chloride infusion   DISCONTD: lactated ringers infusion   pantoprazole (PROTONIX) injection 40 mg   0.9 %  sodium chloride infusion    Admission Imaging : CT ABDOMEN PELVIS WO CONTRAST  Result Date: 03/16/2023 CLINICAL DATA:  Abdominal pain, acute, nonlocalized EXAM: CT ABDOMEN AND PELVIS WITHOUT CONTRAST TECHNIQUE: Multidetector CT imaging of the abdomen and pelvis was performed following the standard protocol without IV contrast. RADIATION DOSE REDUCTION: This exam was performed according to the departmental dose-optimization program which includes automated exposure control, adjustment of the mA and/or kV according to patient size and/or use of iterative reconstruction technique. COMPARISON:  None Available. FINDINGS: Lower chest: No acute abnormality. Hepatobiliary: No focal liver abnormality. No gallstones, gallbladder wall  thickening, or pericholecystic fluid. No biliary dilatation. Pancreas: No focal lesion. Normal pancreatic contour. No surrounding inflammatory changes. No main pancreatic ductal dilatation. Spleen: Normal in size without focal abnormality. Adrenals/Urinary Tract: No adrenal nodule bilaterally. No nephrolithiasis and no hydronephrosis. No definite contour-deforming renal mass. No ureterolithiasis or hydroureter. The urinary bladder is unremarkable. Stomach/Bowel: Stomach is within normal limits. No evidence of bowel wall thickening or dilatation. Question small small bowel intussusception within the right pelvis (5:54-58, 6:51). Appendix appears normal. Vascular/Lymphatic: No abdominal aorta or iliac aneurysm. No abdominal, pelvic, or inguinal lymphadenopathy. Reproductive: Prostate is unremarkable. Other: No intraperitoneal free fluid. No intraperitoneal free gas. No organized fluid collection. Musculoskeletal: No abdominal wall hernia or abnormality. No suspicious lytic or blastic osseous lesions. No acute displaced fracture. Multilevel degenerative changes of the spine. IMPRESSION: Markedly limited evaluation on this noncontrast study in a patient with lack of intraperitoneal fat. No acute intra-abdominal or intrapelvic abnormality. Electronically Signed   By: Tish Frederickson M.D.   On: 03/16/2023 00:06   Physical Examination: Vitals:   03/15/23 2146 03/15/23 2147 03/16/23 0130 03/16/23 0300  BP: 125/88  126/83 109/80  Pulse: (!) 122  (!) 118 98  Temp: 98.9 F (37.2 C)   98 F (36.7 C)  Resp: 20   18  Height:  6\' 2"  (1.88 m)    Weight:  63.5 kg    SpO2: 100%  100% 100%  TempSrc: Oral   Oral  BMI (Calculated):  17.97     Physical Exam Vitals and nursing note reviewed.  Constitutional:      General: He is not in acute distress.    Appearance: Normal appearance. He is not ill-appearing, toxic-appearing or diaphoretic.  HENT:     Head: Normocephalic and atraumatic.     Right Ear: Hearing and  external ear normal.     Left Ear: Hearing and external ear normal.     Nose: Nose normal.  No nasal deformity.     Mouth/Throat:     Lips: Pink.     Mouth: Mucous membranes are moist.     Tongue: No lesions.     Pharynx: Oropharynx is clear.  Eyes:     Extraocular Movements: Extraocular movements intact.     Pupils: Pupils are equal, round, and reactive to light.  Neck:     Vascular: No carotid bruit.  Cardiovascular:     Rate and Rhythm: Normal rate and regular rhythm.     Pulses: Normal pulses.     Heart sounds: Normal heart sounds.  Pulmonary:     Effort: Pulmonary effort is normal.     Breath sounds: Normal breath sounds.  Abdominal:     General: Bowel sounds are normal. There is no distension.     Palpations: Abdomen is soft. There is no mass.     Tenderness: There is no abdominal tenderness. There is no guarding.     Hernia: No hernia is present.  Musculoskeletal:     Right lower leg: No edema.     Left lower leg: No edema.  Skin:    General: Skin is warm.  Neurological:     General: No focal deficit present.     Mental Status: He is alert and oriented to person, place, and time.     Cranial Nerves: Cranial nerves 2-12 are intact.     Motor: Motor function is intact.  Psychiatric:        Attention and Perception: Attention normal.        Mood and Affect: Mood normal.        Speech: Speech normal.        Behavior: Behavior normal. Behavior is cooperative.        Cognition and Memory: Cognition normal.     Assessment and Plan: * Nausea and vomiting Secondary to DKA and acidosis. IV PPI therapy. Zofran held secondary to prolonged QT of 544 with an EKG.   Prolonged QT interval Prolonged QT interval electrolytes magnesium pending. Will correct patient's potassium.  Electrolyte abnormality Repeat BMP is pending we will monitor electrolytes anion gap and serum bicarb and replace accordingly.   AKI (acute kidney injury) [facility]) Lab Results  Component Value Date    CREATININE 1.27 (H) 03/15/2023   CREATININE 0.68 12/15/2014  Attribute secondary to dehydration prerenal etiology do not suspect renal issues. We will however monitor for worsening kidney function and renally dose medications avoid contrast.   DKA (diabetic ketoacidosis) (HCC) -Patient with poor baseline control -A1c pending.  -No indication of illness as source -Otherwise no apparent reason for DKA -Moderate DKA suspected venous blood gas pending. -Will admit to SDU with DKA protocol -Would recommend continuing insulin drip at least until morning regardless of rapidity of closure of gap and normalization of labs -K+ slightly increased at time of presentation but lowered to 5 quickly and so potassium supplementation added -IVF at 150 cc/hr, LR until glucose <250 and then decrease rate to 125 and change to D5LR -Patient to continue the insulin gtt.    DVT prophylaxis:  Heparin Code Status:  Full code    03/15/2023    9:49 PM  Advanced Directives  Does Patient Have a Medical Advance Directive? No  Would patient like information on creating a medical advance directive? No - Patient declined    Family Communication:  Mom at bedside Emergency Contact: Contact Information     Name Relation Home Work [place]  [phone]  [phone]        Disposition Plan:  Home Consults: None Admission status: Inpatient Unit / Expected LOS: Step down/2 days.  Gertha Calkin MD [facility]  6 PM- 2 AM. [phone]( Pager )  For questions regarding this patient please use [email or web address] to contact the current [facility] MD.   Bonita Quin may also call [phone] to contact current Assigned [facility] Attending/Consulting MD for this patient.

## 2023-03-16 NOTE — Assessment & Plan Note (Signed)
Prolonged QT interval electrolytes magnesium pending. Will correct patient's potassium.

## 2023-03-16 NOTE — Hospital Course (Signed)
DKA 32 y/o both him and mom are here. Pt is diabetic since a decade, pt stopped insulin few months ago.

## 2023-03-16 NOTE — Assessment & Plan Note (Signed)
Secondary to DKA and acidosis. IV PPI therapy. Zofran held secondary to prolonged QT of 544 with an EKG.

## 2023-03-16 NOTE — Assessment & Plan Note (Signed)
Lab Results  Component Value Date   CREATININE 1.27 (H) 03/15/2023   CREATININE 0.68 12/15/2014  Attribute secondary to dehydration prerenal etiology do not suspect renal issues. We will however monitor for worsening kidney function and renally dose medications avoid contrast.

## 2023-03-16 NOTE — Progress Notes (Signed)
Brief hospitalist update note.  This is a nonbillable note.  Please see scanned H&P for full billable details.  Briefly, this is a 32 year old male with history significant for diabetes mellitus, unknown type I or type II.  Has not been taking insulin.  Presented with nausea, vomiting, abdominal pain.  Found to be in severe diabetic ketoacidosis.  Placed in stepdown unit on insulin gtt.  As of this note patient's anion gap is closed and his CBGs are within reference limits.  Will transition off IV insulin and onto subcutaneous regimen.  Once off insulin and tolerating p.o. intake and transfer to MedSurg floor.  Diabetes coordinator consulted.  Carb modified diet.  Lolita Patella MD  No charge

## 2023-03-16 NOTE — Assessment & Plan Note (Signed)
Repeat BMP is pending we will monitor electrolytes anion gap and serum bicarb and replace accordingly.

## 2023-03-17 DIAGNOSIS — R112 Nausea with vomiting, unspecified: Secondary | ICD-10-CM | POA: Diagnosis not present

## 2023-03-17 DIAGNOSIS — E1310 Other specified diabetes mellitus with ketoacidosis without coma: Secondary | ICD-10-CM | POA: Diagnosis not present

## 2023-03-17 LAB — BASIC METABOLIC PANEL
Anion gap: 10 (ref 5–15)
BUN: 21 mg/dL — ABNORMAL HIGH (ref 6–20)
CO2: 24 mmol/L (ref 22–32)
Calcium: 9.3 mg/dL (ref 8.9–10.3)
Chloride: 98 mmol/L (ref 98–111)
Creatinine, Ser: 0.67 mg/dL (ref 0.61–1.24)
GFR, Estimated: >60 mL/min (ref >60)
Glucose, Bld: 158 mg/dL — ABNORMAL HIGH (ref 70–99)
Potassium: 3.4 mmol/L — ABNORMAL LOW (ref 3.5–5.1)
Sodium: 132 mmol/L — ABNORMAL LOW (ref 135–145)

## 2023-03-17 LAB — URINALYSIS, ROUTINE W REFLEX MICROSCOPIC
Bacteria, UA: NONE SEEN
Bilirubin Urine: NEGATIVE
Glucose, UA: >=500 mg/dL — AB
Hgb urine dipstick: NEGATIVE
Ketones, ur: 80 mg/dL — AB
Leukocytes,Ua: NEGATIVE
Nitrite: NEGATIVE
Protein, ur: NEGATIVE mg/dL
Specific Gravity, Urine: 1.013 (ref 1.005–1.030)
pH: 6 (ref 5.0–8.0)

## 2023-03-17 LAB — GLUCOSE, CAPILLARY
Glucose-Capillary: 144 mg/dL — ABNORMAL HIGH (ref 70–99)
Glucose-Capillary: 171 mg/dL — ABNORMAL HIGH (ref 70–99)
Glucose-Capillary: 179 mg/dL — ABNORMAL HIGH (ref 70–99)
Glucose-Capillary: 252 mg/dL — ABNORMAL HIGH (ref 70–99)

## 2023-03-17 MED ORDER — LIVING WELL WITH DIABETES BOOK
Freq: Once | Status: AC
  Filled 2023-03-17: qty 1

## 2023-03-17 MED ORDER — INSULIN STARTER KIT- PEN NEEDLES (ENGLISH)
1.0000 | Freq: Once | Status: AC
  Administered 2023-03-17: 1
  Filled 2023-03-17: qty 1

## 2023-03-17 MED ORDER — INSULIN ASPART PROT & ASPART (70-30 MIX) 100 UNIT/ML ~~LOC~~ SUSP: 10.0000 [IU] | Freq: Two times a day (BID) | SUBCUTANEOUS | 11 refills | Status: DC

## 2023-03-17 MED ORDER — ONDANSETRON HCL 4 MG PO TABS: 4.0000 mg | ORAL_TABLET | Freq: Every day | ORAL | 0 refills | Status: DC | PRN

## 2023-03-17 MED ORDER — INSULIN ASPART PROT & ASPART (70-30 MIX) 100 UNIT/ML ~~LOC~~ SUSP
10.0000 [IU] | Freq: Two times a day (BID) | SUBCUTANEOUS | Status: DC
  Filled 2023-03-17: qty 10

## 2023-03-17 NOTE — Progress Notes (Signed)
Patient was educated and self administered his insulin coverage at this time.  No questions noted.  Educated on AVS and where to pick up Rx.  Educated on amount and when to administer 70/30 insulin.  Informed patient that the [facility] would be setting up an appointment for follow up tomorrow and would contact him.  PIVs removed and patient calling for ride and dressing at this time.  All belongings packed per patient at this time.

## 2023-03-17 NOTE — Inpatient Diabetes Management (Addendum)
Inpatient Diabetes Program Recommendations  AACE/ADA: New Consensus Statement on Inpatient Glycemic Control (2015)  Target Ranges:  Prepandial:   less than 140 mg/dL      Peak postprandial:   less than 180 mg/dL (1-2 hours)      Critically ill patients:  140 - 180 mg/dL   Lab Results  Component Value Date   GLUCAP 171 (H) 03/17/2023   HGBA1C SEE COMMENT 12/15/2014    Review of Glycemic Control  Latest Reference Range & Units 03/17/23 00:11 03/17/23 03:30 03/17/23 07:55  Glucose-Capillary 70 - 99 mg/dL 161 (H) 096 (H) 045 (H)  (H): Data is abnormally high Diabetes history: DM Outpatient Diabetes medications: none Current orders for Inpatient glycemic control: IV insulin to transition to Semglee 10 units Qd, Novolog 0-15 units Q4H  Inpatient Diabetes Program Recommendations:    Consider: - Changing regimen to Novolog 70/30 10 units BID (to start now) - changing correction to TID & HS  Attempted to reach out to patient x 2 without success. Secure chat sent to RN to further assist.  LWWDM booklet, insulin starter kit and TOC consult placed.  DM coordinator working remotely over weekend to cover multiple campuses.   Addendum: spoke with patient to discuss outpatient diabetes management. Patient not currently taking medications, however has been on insulin in the past. Reports discontinuation due to being in prison and never restarted.  A1C pending, however anticipate result elevated. Explained what a A1c is and what it measures. Also reviewed goal A1c with patient, importance of good glucose control @ home, and blood sugar goals. Reviewed patho of Dm, differences between type 1 & 2, role of pancreas, DKA, survival skills, interventions signs and symptoms of hypo vs hyper glycemia, vascular changes and commorbidities. Discussed [employer] products to include Novolin 70/30 and meter supplies. Able to purchase and ensure administration with a meal.  Patient has meter but does not use. Reviewed  recommended frequency and when to call MD. TOC placed for PCP follow up. RN to ensure patient able to perform self injection.  Admits to drinking sugary beverages. Reviewed alternatives and importance o being mindful of CHo intake.  No questions at this time. Discussed recs with MD. Orders updated.    Thanks, Lujean Rave, MSN, RNC-OB Diabetes Coordinator [phone] (8a-5p)

## 2023-03-17 NOTE — Discharge Summary (Signed)
Physician Discharge Summary  Vincent Morgan ZOX:[id number] DOB: [birth year] DOA: 03/16/2023  PCP: Patient, No Pcp Per  Admit date: 03/16/2023 Discharge date: 03/17/2023  Admitted From: Home Disposition:  Home  Recommendations for Outpatient Follow-up:  Follow up with PCP in 1-2 weeks   Home Health:No Equipment/Devices:None   Discharge Condition:Stable  CODE STATUS:FULL  Diet recommendation: Carb mod  Brief/Interim Summary: 32 year old male with history significant for diabetes mellitus, unknown type I or type II. Has not been taking insulin. Presented with nausea, vomiting, abdominal pain. Found to be in severe diabetic ketoacidosis. Placed in stepdown unit on insulin gtt.   DKA resolved.  Diet advanced.  Patient placed on subcutaneous insulin regimen.  Tolerating p.o. intake but no nausea or vomiting.  Glycemic control markedly improved.  Discussed case with diabetes coordinator who educated patient.  Will discharge on NovoLog 70/30, 10 units twice daily.  Sent to [employer].  Patient will need to establish care with primary care physician.  Will reach out to [facility] on Monday 5/6 to help arrange this.    Discharge Diagnoses:  Principal Problem:   Nausea and vomiting Active Problems:   DKA (diabetic ketoacidosis) (HCC)   AKI (acute kidney injury) (HCC)   Electrolyte abnormality   Prolonged QT interval  DKA Diabetes mellitus, poor control with hyperglycemia Unclear trigger for DKA.  Likely medication nonadherence.  Patient had previously been on insulin but had not been taking for unclear reasons.  DKA resolved and glycemic control improved.  Diabetes coordinator consult appreciated.  At time of discharge will recommend NovoLog 70/30, 10 units twice daily.  Patient will need to establish primary care.  Will reach out to [facility] on Monday 5/6 to help arrange.  Discharge Instructions  Discharge Instructions     Diet Carb Modified   Complete by: As directed    Increase activity  slowly   Complete by: As directed       Allergies as of 03/17/2023   No Known Allergies      Medication List     TAKE these medications    insulin aspart protamine- aspart (70-30) 100 UNIT/ML injection Commonly known as: NOVOLOG MIX 70/30 Inject 0.1 mLs (10 Units total) into the skin 2 (two) times daily with a meal.   ondansetron 4 MG tablet Commonly known as: Zofran Take 1 tablet (4 mg total) by mouth daily as needed for nausea or vomiting.        No Known Allergies  Consultations: None   Procedures/Studies: CT ABDOMEN PELVIS WO CONTRAST  Result Date: 03/16/2023 CLINICAL DATA:  Abdominal pain, acute, nonlocalized EXAM: CT ABDOMEN AND PELVIS WITHOUT CONTRAST TECHNIQUE: Multidetector CT imaging of the abdomen and pelvis was performed following the standard protocol without IV contrast. RADIATION DOSE REDUCTION: This exam was performed according to the departmental dose-optimization program which includes automated exposure control, adjustment of the mA and/or kV according to patient size and/or use of iterative reconstruction technique. COMPARISON:  None Available. FINDINGS: Lower chest: No acute abnormality. Hepatobiliary: No focal liver abnormality. No gallstones, gallbladder wall thickening, or pericholecystic fluid. No biliary dilatation. Pancreas: No focal lesion. Normal pancreatic contour. No surrounding inflammatory changes. No main pancreatic ductal dilatation. Spleen: Normal in size without focal abnormality. Adrenals/Urinary Tract: No adrenal nodule bilaterally. No nephrolithiasis and no hydronephrosis. No definite contour-deforming renal mass. No ureterolithiasis or hydroureter. The urinary bladder is unremarkable. Stomach/Bowel: Stomach is within normal limits. No evidence of bowel wall thickening or dilatation. Question small small bowel intussusception within the right  pelvis (5:54-58, 6:51). Appendix appears normal. Vascular/Lymphatic: No abdominal aorta or iliac  aneurysm. No abdominal, pelvic, or inguinal lymphadenopathy. Reproductive: Prostate is unremarkable. Other: No intraperitoneal free fluid. No intraperitoneal free gas. No organized fluid collection. Musculoskeletal: No abdominal wall hernia or abnormality. No suspicious lytic or blastic osseous lesions. No acute displaced fracture. Multilevel degenerative changes of the spine. IMPRESSION: Markedly limited evaluation on this noncontrast study in a patient with lack of intraperitoneal fat. No acute intra-abdominal or intrapelvic abnormality. Electronically Signed   By: Tish Frederickson M.D.   On: 03/16/2023 00:06      Subjective: Seen and examined on the day of discharge.  Stable, no distress.  Tolerating p.o. intake.  Anion gap closed.  Glycemic control improved.  Stable for discharge.  Discharge Exam: Vitals:   03/17/23 0900 03/17/23 1200  BP:  121/88  Pulse: 92   Resp: 14 12  Temp:  98.2 F (36.8 C)  SpO2: 99% 99%   Vitals:   03/17/23 0700 03/17/23 0800 03/17/23 0900 03/17/23 1200  BP: 103/72 109/86  121/88  Pulse: 95 78 92   Resp: (!) 22 18 14 12   Temp:  98.3 F (36.8 C)  98.2 F (36.8 C)  TempSrc:  Oral  Oral  SpO2: 100% 100% 99% 99%  Weight:      Height:        General: Pt is alert, awake, not in acute distress Cardiovascular: RRR, S1/S2 +, no rubs, no gallops Respiratory: CTA bilaterally, no wheezing, no rhonchi Abdominal: Soft, NT, ND, bowel sounds + Extremities: no edema, no cyanosis    The results of significant diagnostics from this hospitalization (including imaging, microbiology, ancillary and laboratory) are listed below for reference.     Microbiology: No results found for this or any previous visit (from the past 240 hour(s)).   Labs: BNP (last 3 results) No results for input(s): "BNP" in the last 8760 hours. Basic Metabolic Panel: Recent Labs  Lab 03/15/23 2155 03/16/23 0543 03/16/23 0552 03/16/23 0938 03/16/23 1329 03/17/23 0536  NA 135  --   133* 135 136 132*  K 3.6  --  3.1* 3.5 3.6 3.4*  CL 91*  --  93* 96* 100 98  CO2 13*  --  21* 25 26 24   GLUCOSE 410*  --  263* 230* 174* 158*  BUN 22*  --  25* 19 21* 21*  CREATININE 1.27*  --  0.97 1.04 0.70 0.67  CALCIUM 10.6*  --  10.1 9.9 9.2 9.3  MG  --  2.1  --   --   --   --    Liver Function Tests: Recent Labs  Lab 03/15/23 2155  AST 29  ALT 22  ALKPHOS 72  BILITOT 2.1*  PROT 9.4*  ALBUMIN 5.2*   Recent Labs  Lab 03/15/23 2155  LIPASE 23   No results for input(s): "AMMONIA" in the last 168 hours. CBC: Recent Labs  Lab 03/15/23 2155  WBC 14.1*  HGB 17.7*  HCT 51.1  MCV 89.8  PLT 407*   Cardiac Enzymes: Recent Labs  Lab 03/16/23 0552  CKTOTAL 79   BNP: Invalid input(s): "POCBNP" CBG: Recent Labs  Lab 03/16/23 2057 03/17/23 0011 03/17/23 0330 03/17/23 0755 03/17/23 1124  GLUCAP 163* 144* 179* 171* 252*   D-Dimer No results for input(s): "DDIMER" in the last 72 hours. Hgb A1c No results for input(s): "HGBA1C" in the last 72 hours. Lipid Profile Recent Labs    03/16/23 0552  CHOL 249*  HDL 73  LDLCALC 147*  TRIG 143  CHOLHDL 3.4   Thyroid function studies No results for input(s): "TSH", "T4TOTAL", "T3FREE", "THYROIDAB" in the last 72 hours.  Invalid input(s): "FREET3" Anemia work up No results for input(s): "VITAMINB12", "FOLATE", "FERRITIN", "TIBC", "IRON", "RETICCTPCT" in the last 72 hours. Urinalysis    Component Value Date/Time   COLORURINE YELLOW (A) 03/17/2023 1200   APPEARANCEUR CLEAR (A) 03/17/2023 1200   APPEARANCEUR Clear 12/15/2014 0739   LABSPEC 1.013 03/17/2023 1200   LABSPEC 1.036 12/15/2014 0739   PHURINE 6.0 03/17/2023 1200   GLUCOSEU >=500 (A) 03/17/2023 1200   GLUCOSEU >=500 12/15/2014 0739   HGBUR NEGATIVE 03/17/2023 1200   BILIRUBINUR NEGATIVE 03/17/2023 1200   BILIRUBINUR Negative 12/15/2014 0739   KETONESUR 80 (A) 03/17/2023 1200   PROTEINUR NEGATIVE 03/17/2023 1200   NITRITE NEGATIVE 03/17/2023 1200    LEUKOCYTESUR NEGATIVE 03/17/2023 1200   LEUKOCYTESUR Negative 12/15/2014 0739   Sepsis Labs Recent Labs  Lab 03/15/23 2155  WBC 14.1*   Microbiology No results found for this or any previous visit (from the past 240 hour(s)).   Time coordinating discharge: Over 30 minutes  SIGNED:   Tresa Moore, MD  [facility] 03/17/2023, 2:18 PM Pager   If 7PM-7AM, please contact night-coverage

## 2023-03-18 ENCOUNTER — Ambulatory Visit: Payer: Self-pay | Admitting: *Deleted

## 2023-03-18 LAB — HEMOGLOBIN A1C
Hgb A1c MFr Bld: >15.5 % — ABNORMAL HIGH (ref 4.8–5.6)
Mean Plasma Glucose: >398 mg/dL

## 2023-03-18 LAB — GLUCOSE, CAPILLARY: Glucose-Capillary: 498 mg/dL — ABNORMAL HIGH (ref 70–99)

## 2023-03-18 NOTE — Telephone Encounter (Signed)
  Chief Complaint: Vomiting Symptoms: Seen in Ed and admitted to ICU 03/16/23, discharged 03/17/23.  3-4 times today "Can't keep anything down, Zofran not working." Voided only once all day today, urine dark. BS 244 "Didn't eat anything."  Frequency: In ED 03/16/23 Pertinent Negatives: Patient denies  Disposition: [x] ED /[] Urgent Care (no appt availability in office) / [] Appointment(In office/virtual)/ []  [facility]/ [] Home Care/ [] Refused Recommended Disposition /[] [facility]/ []  Follow-up with PCP Additional Notes: Pt's mother calling, pt present. Advised ED, states will follow disposition. Reason for Disposition  [1] Drinking very little AND [2] dehydration suspected (e.g., no urine > 12 hours, very dry mouth, very lightheaded)  Answer Assessment - Initial Assessment Questions 1. VOMITING SEVERITY: "How many times have you vomited in the past 24 hours?"     - MILD:  1 - 2 times/day    - MODERATE: 3 - 5 times/day, decreased oral intake without significant weight loss or symptoms of dehydration    - SEVERE: 6 or more times/day, vomits everything or nearly everything, with significant weight loss, symptoms of dehydration      3-4 times 2. ONSET: "When did the vomiting begin?"      Seen in ED 03/16/23 3. FLUIDS: "What fluids or food have you vomited up today?" "Have you been able to keep any fluids down?"     No 4. ABDOMEN PAIN: "Are your having any abdomen pain?" If Yes : "How bad is it and what does it feel like?" (e.g., crampy, dull, intermittent, constant)      No 5. DIARRHEA: "Is there any diarrhea?" If Yes, ask: "How many times today?"      no 6. CONTACTS: "Is there anyone else in the family with the same symptoms?"      No 7. CAUSE: "What do you think is causing your vomiting?"     Unsure 8. HYDRATION STATUS: "Any signs of dehydration?" (e.g., dry mouth [not only dry lips], too weak to stand) "When did you last urinate?"     Urine dark, less voiding 9. OTHER  SYMPTOMS: "Do you have any other symptoms?" (e.g., fever, headache, vertigo, vomiting blood or coffee grounds, recent head injury)  Protocols used: Vomiting-A-AH

## 2023-07-22 ENCOUNTER — Emergency Department
Admission: EM | Admit: 2023-07-22 | Discharge: 2023-07-22 | Disposition: A | Discharge status: Home / Self Care | Payer: 59 | Attending: Emergency Medicine | Admitting: Emergency Medicine

## 2023-07-22 ENCOUNTER — Encounter: Payer: Self-pay | Admitting: Emergency Medicine

## 2023-07-22 ENCOUNTER — Other Ambulatory Visit: Payer: Self-pay

## 2023-07-22 ENCOUNTER — Emergency Department: Payer: 59

## 2023-07-22 DIAGNOSIS — R1084 Generalized abdominal pain: Secondary | ICD-10-CM | POA: Diagnosis present

## 2023-07-22 DIAGNOSIS — K5900 Constipation, unspecified: Secondary | ICD-10-CM | POA: Insufficient documentation

## 2023-07-22 DIAGNOSIS — R739 Hyperglycemia, unspecified: Secondary | ICD-10-CM

## 2023-07-22 DIAGNOSIS — R110 Nausea: Secondary | ICD-10-CM | POA: Insufficient documentation

## 2023-07-22 DIAGNOSIS — E1165 Type 2 diabetes mellitus with hyperglycemia: Secondary | ICD-10-CM | POA: Insufficient documentation

## 2023-07-22 LAB — CBC
HCT: 43.5 % (ref 39.0–52.0)
Hemoglobin: 15.4 g/dL (ref 13.0–17.0)
MCH: 31.5 pg (ref 26.0–34.0)
MCHC: 35.4 g/dL (ref 30.0–36.0)
MCV: 89 fL (ref 80.0–100.0)
Platelets: 328 10*3/uL (ref 150–400)
RBC: 4.89 MIL/uL (ref 4.22–5.81)
RDW: 11.9 % (ref 11.5–15.5)
WBC: 7.6 10*3/uL (ref 4.0–10.5)
nRBC: 0 % (ref 0.0–0.2)

## 2023-07-22 LAB — COMPREHENSIVE METABOLIC PANEL
ALT: 17 U/L (ref 0–44)
AST: 17 U/L (ref 15–41)
Albumin: 4.5 g/dL (ref 3.5–5.0)
Alkaline Phosphatase: 46 U/L (ref 38–126)
Anion gap: 14 (ref 5–15)
BUN: 14 mg/dL (ref 6–20)
CO2: 26 mmol/L (ref 22–32)
Calcium: 9.6 mg/dL (ref 8.9–10.3)
Chloride: 95 mmol/L — ABNORMAL LOW (ref 98–111)
Creatinine, Ser: 0.56 mg/dL — ABNORMAL LOW (ref 0.61–1.24)
GFR, Estimated: >60 mL/min (ref >60)
Glucose, Bld: 187 mg/dL — ABNORMAL HIGH (ref 70–99)
Potassium: 3.4 mmol/L — ABNORMAL LOW (ref 3.5–5.1)
Sodium: 135 mmol/L (ref 135–145)
Total Bilirubin: 1.3 mg/dL — ABNORMAL HIGH (ref 0.3–1.2)
Total Protein: 7.3 g/dL (ref 6.5–8.1)

## 2023-07-22 LAB — CBG MONITORING, ED: Glucose-Capillary: 179 mg/dL — ABNORMAL HIGH (ref 70–99)

## 2023-07-22 LAB — BETA-HYDROXYBUTYRIC ACID: Beta-Hydroxybutyric Acid: 2.02 mmol/L — ABNORMAL HIGH (ref 0.05–0.27)

## 2023-07-22 LAB — LIPASE, BLOOD: Lipase: 29 U/L (ref 11–51)

## 2023-07-22 MED ORDER — POLYETHYLENE GLYCOL 3350 17 G PO PACK: 17.0000 g | PACK | Freq: Every day | ORAL | 0 refills | Status: DC

## 2023-07-22 MED ORDER — IOHEXOL 300 MG/ML  SOLN
100.0000 mL | Freq: Once | INTRAMUSCULAR | Status: AC | PRN
  Administered 2023-07-22: 100 mL via INTRAVENOUS

## 2023-07-22 MED ORDER — SODIUM CHLORIDE 0.9 % IV BOLUS
1000.0000 mL | Freq: Once | INTRAVENOUS | Status: AC
  Administered 2023-07-22: 1000 mL via INTRAVENOUS

## 2023-07-22 NOTE — Group Note (Deleted)

## 2023-07-22 NOTE — ED Provider Notes (Signed)
[facility] Provider Note    Event Date/Time   First MD Initiated Contact with Patient 07/22/23 [phone]     (approximate)   History   Nausea   HPI  Vincent Morgan is a 32 y.o. male with a past medical history of diabetes who presents today for evaluation of diffuse abdominal pain and nausea several months.  He reports that he is not able to eat because it always makes him feel nauseous.  He has had decreased bowel movements.  He reports that he feels tired and also dehydrated.  No fevers or chills.  No recent travel outside of [place].  No blood in his stool.  No cough.  Patient Active Problem List   Diagnosis Date Noted   Nausea and vomiting 03/16/2023   DKA (diabetic ketoacidosis) (HCC) 03/16/2023   AKI (acute kidney injury) (HCC) 03/16/2023   Electrolyte abnormality 03/16/2023   Prolonged QT interval 03/16/2023          Physical Exam   Triage Vital Signs: ED Triage Vitals  Encounter Vitals Group     BP 07/22/23 0847 125/89     Systolic BP Percentile --      Diastolic BP Percentile --      Pulse Rate 07/22/23 0847 (!) 107     Resp 07/22/23 0847 17     Temp 07/22/23 0847 97.6 F (36.4 C)     Temp Source 07/22/23 0847 Oral     SpO2 07/22/23 0847 100 %     Weight 07/22/23 0848 139 lb 15.9 oz (63.5 kg)     Height 07/22/23 0848 6\' 2"  (1.88 m)     Head Circumference --      Peak Flow --      Pain Score 07/22/23 0848 10     Pain Loc --      Pain Education --      Exclude from Growth Chart --     Most recent vital signs: Vitals:   07/22/23 0847  BP: 125/89  Pulse: (!) 107  Resp: 17  Temp: 97.6 F (36.4 C)  SpO2: 100%    Physical Exam Vitals and nursing note reviewed.  Constitutional:      General: Awake and alert. No acute distress.  Thin male    Appearance: Normal appearance. The patient is normal weight.  HENT:     Head: Normocephalic and atraumatic.     Mouth: Mucous membranes are moist.  Eyes:     General: PERRL.  Normal EOMs        Right eye: No discharge.        Left eye: No discharge.     Conjunctiva/sclera: Conjunctivae normal.  Cardiovascular:     Rate and Rhythm: Normal rate and regular rhythm.     Pulses: Normal pulses.  Pulmonary:     Effort: Pulmonary effort is normal. No respiratory distress.     Breath sounds: Normal breath sounds.  Abdominal:     Abdomen is soft. There is no abdominal tenderness. No rebound or guarding. No distention. Musculoskeletal:        General: No swelling. Normal range of motion.     Cervical back: Normal range of motion and neck supple.  Skin:    General: Skin is warm and dry.     Capillary Refill: Capillary refill takes less than 2 seconds.     Findings: No rash.  Neurological:     Mental Status: The patient is awake and alert.  ED Results / Procedures / Treatments   Labs (all labs ordered are listed, but only abnormal results are displayed) Labs Reviewed  COMPREHENSIVE METABOLIC PANEL - Abnormal; Notable for the following components:      Result Value   Potassium 3.4 (*)    Chloride 95 (*)    Glucose, Bld 187 (*)    Creatinine, Ser 0.56 (*)    Total Bilirubin 1.3 (*)    All other components within normal limits  BLOOD GAS, VENOUS - Abnormal; Notable for the following components:   Bicarbonate 31.2 (*)    Acid-Base Excess 4.5 (*)    All other components within normal limits  BETA-HYDROXYBUTYRIC ACID - Abnormal; Notable for the following components:   Beta-Hydroxybutyric Acid 2.02 (*)    All other components within normal limits  CBG MONITORING, ED - Abnormal; Notable for the following components:   Glucose-Capillary 179 (*)    All other components within normal limits  LIPASE, BLOOD  CBC  URINALYSIS, ROUTINE W REFLEX MICROSCOPIC     EKG     RADIOLOGY I independently reviewed and interpreted imaging and agree with radiologists findings.     PROCEDURES:  Critical Care performed:   Procedures   MEDICATIONS ORDERED IN  ED: Medications  sodium chloride 0.9 % bolus 1,000 mL (0 mLs Intravenous Stopped 07/22/23 1106)  iohexol (OMNIPAQUE) 300 MG/ML solution 100 mL (100 mLs Intravenous Contrast Given 07/22/23 1010)     IMPRESSION / MDM / ASSESSMENT AND PLAN / ED COURSE  I reviewed the triage vital signs and the nursing notes.   Differential diagnosis includes, but is not limited to, hypoglycemia, diabetic ketoacidosis, cyclic vomiting, gastroparesis, dehydration, electrolyte disarray.  Patient is awake and alert, hemodynamically stable and afebrile.  He is pacing around the room, though lays down on the stretcher for physical exam.  His abdomen is soft and nontender throughout.  I reviewed the patient's chart.  Patient has been seen multiple times for nausea, vomiting, hyperglycemia, and cannabis hyperemesis, most recently 2 days ago.  Further workup is indicated.  Labs were repeated today and are overall reassuring.  He was found to be hyperglycemic to 179, though normal bicarb, normal anion gap, not consistent with DKA.  Given his abdominal pain, CT scan obtained and is negative for any acute intra-abdominal pathology, though does reveal constipation.  He was hydrated with 1 L of normal saline with resolution of his pain.  He had no vomiting and did not require any antiemetics.  He is able to tolerate p.o.  Reports that his nausea had resolved after the fluids as well.  We discussed importance of proper glucose control.  Recommend close outpatient follow-up with his PCP.  He was started on MiraLAX for his constipation.  Patient understands and agrees with plan.  He was discharged in stable condition.  Patient's presentation is most consistent with exacerbation of chronic illness.   Clinical Course as of 07/22/23 1331  Mon Jul 22, 2023  1054 Patient reports that he feels significantly improved and ready for discharge [JP]    Clinical Course User Index [JP] Venus Ruhe, Herb Grays, PA-C     FINAL CLINICAL IMPRESSION(S)  / ED DIAGNOSES   Final diagnoses:  Nausea  Hyperglycemia  Constipation, unspecified constipation type     Rx / DC Orders   ED Discharge Orders          Ordered    polyethylene glycol (MIRALAX) 17 g packet  Daily        07/22/23  1055    Ambulatory Referral to Primary Care (Establish Care)       Comments: Diabetes, gastroparesis   07/22/23 1056             Note:  This document was prepared using Dragon voice recognition software and may include unintentional dictation errors.   Jackelyn Hoehn, PA-C 07/22/23 1331    Pilar Jarvis, MD 07/22/23 1438

## 2023-07-22 NOTE — Discharge Instructions (Addendum)
You have been referred to primary care for continued management of your diabetes.  You are also found to be constipated.  You may take the medication as prescribed to help with that problem.  Please return for any new, worsening, or change in symptoms or other concerns.  It was a pleasure caring for you today.

## 2023-07-22 NOTE — ED Triage Notes (Signed)
Pt here with nausea and vomiting, pt states this is an ongoing problem. Pt states he thinks he may be dehydrated. Pt states his abd pain is all over and the pain is intermittent.

## 2023-07-23 ENCOUNTER — Emergency Department
Admission: EM | Admit: 2023-07-23 | Discharge: 2023-07-23 | Disposition: A | Discharge status: Home / Self Care | Payer: 59 | Attending: Emergency Medicine | Admitting: Emergency Medicine

## 2023-07-23 DIAGNOSIS — E119 Type 2 diabetes mellitus without complications: Secondary | ICD-10-CM | POA: Diagnosis not present

## 2023-07-23 DIAGNOSIS — K5900 Constipation, unspecified: Secondary | ICD-10-CM | POA: Diagnosis present

## 2023-07-23 MED ORDER — MAGNESIUM CITRATE PO SOLN
1.0000 | Freq: Once | ORAL | Status: AC
  Administered 2023-07-23: 1 via ORAL
  Filled 2023-07-23: qty 296

## 2023-07-23 MED ORDER — ONDANSETRON 4 MG PO TBDP
4.0000 mg | ORAL_TABLET | Freq: Once | ORAL | Status: AC
  Administered 2023-07-23: 4 mg via ORAL
  Filled 2023-07-23: qty 1

## 2023-07-23 NOTE — ED Provider Notes (Signed)
[facility] Provider Note    Event Date/Time   First MD Initiated Contact with Patient 07/23/23 [phone]     (approximate)   History   Constipation   HPI  Vincent Morgan is a 32 y.o. male who presents to the ED for evaluation of Constipation   I reviewed ED visit from yesterday morning, less than 24 hours ago.  History of DM.  He was seen for nausea and constipation.  Benign workup without evidence of DKA considering his DM.  CT scan with mild-moderate stool burden throughout.  Discharged with recommendations for OTC medications such as MiraLAX.  Patient presents to the ED because he has not yet passed any stool.  Asking me to "suck it out of me."  Physical Exam   Triage Vital Signs: ED Triage Vitals  Encounter Vitals Group     BP 07/23/23 0420 121/88     Systolic BP Percentile --      Diastolic BP Percentile --      Pulse Rate 07/23/23 0420 90     Resp 07/23/23 0420 18     Temp 07/23/23 0420 97.8 F (36.6 C)     Temp Source 07/23/23 0420 Oral     SpO2 07/23/23 0420 100 %     Weight 07/23/23 0426 130 lb (59 kg)     Height 07/23/23 0426 6\' 2"  (1.88 m)     Head Circumference --      Peak Flow --      Pain Score 07/23/23 0425 10     Pain Loc --      Pain Education --      Exclude from Growth Chart --     Most recent vital signs: Vitals:   07/23/23 0420  BP: 121/88  Pulse: 90  Resp: 18  Temp: 97.8 F (36.6 C)  SpO2: 100%    General: Awake, no distress.  CV:  Good peripheral perfusion.  Resp:  Normal effort.  Abd:  No distention.  Soft and benign throughout MSK:  No deformity noted.  Neuro:  No focal deficits appreciated. Other:     ED Results / Procedures / Treatments   Labs (all labs ordered are listed, but only abnormal results are displayed) Labs Reviewed - No data to display  EKG   RADIOLOGY I reviewed CT abdomen/pelvis from yesterday with mild-moderate stool burden  Official radiology report(s): CT ABDOMEN PELVIS W  CONTRAST  Result Date: 07/22/2023 CLINICAL DATA:  Abdominal pain, acute, nonlocalized. EXAM: CT ABDOMEN AND PELVIS WITH CONTRAST TECHNIQUE: Multidetector CT imaging of the abdomen and pelvis was performed using the standard protocol following bolus administration of intravenous contrast. RADIATION DOSE REDUCTION: This exam was performed according to the departmental dose-optimization program which includes automated exposure control, adjustment of the mA and/or kV according to patient size and/or use of iterative reconstruction technique. CONTRAST:  OMNIPAQUE IOHEXOL 300 MG/ML  SOLN COMPARISON:  CT abdomen/pelvis 03/15/2023. FINDINGS: Lower chest: No acute abnormality. Hepatobiliary: Hepatic steatosis. Gallbladder is unremarkable. No biliary dilatation. Pancreas: Unremarkable. No ductal dilatation. Evaluation for surrounding inflammatory changes is limited by paucity of intra-abdominal fat. Spleen: Normal. Adrenals/Urinary Tract: Adrenal glands are unremarkable. Kidneys are normal, without renal calculi, focal lesion, or hydronephrosis. Bladder is unremarkable. Stomach/Bowel: Normal stomach and duodenum. No dilated loops of small bowel. Appendix is not well visualized due to paucity of intra-abdominal fat. Moderate burden of stool throughout the colon. No definite bowel wall thickening. Vascular/Lymphatic: No significant vascular findings are present. No  enlarged abdominal or pelvic lymph nodes. Reproductive: Prostate is unremarkable. Other: No abdominal wall hernia or abnormality. No abdominopelvic ascites. Musculoskeletal: No acute or significant osseous findings. IMPRESSION: 1. No acute abdominopelvic findings. 2. Hepatic steatosis. 3. Moderate burden of stool throughout the colon. Electronically Signed   By: Orvan Falconer M.D.   On: 07/22/2023 10:43    PROCEDURES and INTERVENTIONS:  Procedures  Medications  ondansetron (ZOFRAN-ODT) disintegrating tablet 4 mg (4 mg Oral Given 07/23/23 0529)   magnesium citrate solution 1 Bottle (1 Bottle Oral Given 07/23/23 0529)     IMPRESSION / MDM / ASSESSMENT AND PLAN / ED COURSE  I reviewed the triage vital signs and the nursing notes.  Differential diagnosis includes, but is not limited to, SBO, constipation, DKA   Patient presents with persistent constipation suitable for outpatient management.  No indications for diagnostics.  Discussed expectant management and return precautions.  Reassuring exam      FINAL CLINICAL IMPRESSION(S) / ED DIAGNOSES   Final diagnoses:  Constipation, unspecified constipation type     Rx / DC Orders   ED Discharge Orders     None        Note:  This document was prepared using Dragon voice recognition software and may include unintentional dictation errors.   Delton Prairie, MD 07/23/23 [phone]

## 2023-07-23 NOTE — ED Triage Notes (Signed)
Pt presents to ER with c/o abd pain that has been ongoing since being seen in ER yesterday.  Pt reports he has been unable to have a BM in last 3 days, and is having n/v and states "I need something to suck the shit out of me."  Pt reports he has taken mirilax as directed without producing any BM.  Had blood work and CT done yesterday which showed some "moderate stool burden."  Pt is otherwise A&O x4 and in NAD at this time.

## 2023-07-23 NOTE — Discharge Instructions (Signed)
Keep taking the medications, such as miralax.   Use nausea medicine as needed  Drink plenty of water.

## 2023-08-04 LAB — BLOOD GAS, VENOUS
Acid-Base Excess: 4.5 mmol/L — ABNORMAL HIGH (ref 0.0–2.0)
Bicarbonate: 31.2 mmol/L — ABNORMAL HIGH (ref 20.0–28.0)
O2 Saturation: 31.4 %
Patient temperature: 37
pCO2, Ven: 54 mmHg (ref 44–60)
pH, Ven: 7.37 (ref 7.25–7.43)

## 2023-11-03 ENCOUNTER — Other Ambulatory Visit: Payer: Self-pay

## 2023-11-03 ENCOUNTER — Emergency Department
Admission: EM | Admit: 2023-11-03 | Discharge: 2023-11-03 | Disposition: A | Discharge status: Home / Self Care | Payer: 59 | Attending: Student in an Organized Health Care Education/Training Program | Admitting: Student in an Organized Health Care Education/Training Program

## 2023-11-03 DIAGNOSIS — E109 Type 1 diabetes mellitus without complications: Secondary | ICD-10-CM | POA: Diagnosis not present

## 2023-11-03 DIAGNOSIS — R109 Unspecified abdominal pain: Secondary | ICD-10-CM | POA: Diagnosis not present

## 2023-11-03 DIAGNOSIS — Z20822 Contact with and (suspected) exposure to covid-19: Secondary | ICD-10-CM | POA: Insufficient documentation

## 2023-11-03 DIAGNOSIS — R112 Nausea with vomiting, unspecified: Secondary | ICD-10-CM | POA: Insufficient documentation

## 2023-11-03 LAB — URINALYSIS, ROUTINE W REFLEX MICROSCOPIC
Bacteria, UA: NONE SEEN
Bilirubin Urine: NEGATIVE
Glucose, UA: >=500 mg/dL — AB
Hgb urine dipstick: NEGATIVE
Ketones, ur: 20 mg/dL — AB
Leukocytes,Ua: NEGATIVE
Nitrite: NEGATIVE
Protein, ur: NEGATIVE mg/dL
Specific Gravity, Urine: 1.04 — ABNORMAL HIGH (ref 1.005–1.030)
pH: 8 (ref 5.0–8.0)

## 2023-11-03 LAB — COMPREHENSIVE METABOLIC PANEL
ALT: 16 U/L (ref 0–44)
AST: 15 U/L (ref 15–41)
Albumin: 4.5 g/dL (ref 3.5–5.0)
Alkaline Phosphatase: 82 U/L (ref 38–126)
Anion gap: 13 (ref 5–15)
BUN: 11 mg/dL (ref 6–20)
CO2: 26 mmol/L (ref 22–32)
Calcium: 9.7 mg/dL (ref 8.9–10.3)
Chloride: 97 mmol/L — ABNORMAL LOW (ref 98–111)
Creatinine, Ser: 0.56 mg/dL — ABNORMAL LOW (ref 0.61–1.24)
GFR, Estimated: >60 mL/min (ref >60)
Glucose, Bld: 229 mg/dL — ABNORMAL HIGH (ref 70–99)
Potassium: 3.6 mmol/L (ref 3.5–5.1)
Sodium: 136 mmol/L (ref 135–145)
Total Bilirubin: 0.8 mg/dL (ref <1.2)
Total Protein: 7.7 g/dL (ref 6.5–8.1)

## 2023-11-03 LAB — RESP PANEL BY RT-PCR (RSV, FLU A&B, COVID)  RVPGX2
Influenza A by PCR: NEGATIVE
Influenza B by PCR: NEGATIVE
Resp Syncytial Virus by PCR: NEGATIVE
SARS Coronavirus 2 by RT PCR: NEGATIVE

## 2023-11-03 LAB — CBC
HCT: 44.7 % (ref 39.0–52.0)
Hemoglobin: 15.9 g/dL (ref 13.0–17.0)
MCH: 31.4 pg (ref 26.0–34.0)
MCHC: 35.6 g/dL (ref 30.0–36.0)
MCV: 88.3 fL (ref 80.0–100.0)
Platelets: 404 10*3/uL — ABNORMAL HIGH (ref 150–400)
RBC: 5.06 MIL/uL (ref 4.22–5.81)
RDW: 11.9 % (ref 11.5–15.5)
WBC: 10.7 10*3/uL — ABNORMAL HIGH (ref 4.0–10.5)
nRBC: 0 % (ref 0.0–0.2)

## 2023-11-03 LAB — LIPASE, BLOOD: Lipase: 28 U/L (ref 11–51)

## 2023-11-03 LAB — CBG MONITORING, ED
CBG: 167
Glucose-Capillary: 209 mg/dL — ABNORMAL HIGH (ref 70–99)

## 2023-11-03 MED ORDER — MORPHINE SULFATE (PF) 4 MG/ML IV SOLN
4.0000 mg | INTRAVENOUS | Status: DC | PRN
  Administered 2023-11-03: 4 mg via INTRAVENOUS
  Filled 2023-11-03: qty 1

## 2023-11-03 MED ORDER — ONDANSETRON HCL 4 MG/2ML IJ SOLN
4.0000 mg | Freq: Once | INTRAMUSCULAR | Status: AC
  Administered 2023-11-03: 4 mg via INTRAVENOUS
  Filled 2023-11-03: qty 2

## 2023-11-03 MED ORDER — PROMETHAZINE HCL 25 MG PO TABS: 25.0000 mg | ORAL_TABLET | Freq: Four times a day (QID) | ORAL | 0 refills | Status: DC | PRN

## 2023-11-03 MED ORDER — SODIUM CHLORIDE 0.9 % IV BOLUS
1000.0000 mL | Freq: Once | INTRAVENOUS | Status: AC
  Administered 2023-11-03: 1000 mL via INTRAVENOUS

## 2023-11-03 NOTE — ED Triage Notes (Signed)
Pt arrives with c/o n/v that started today. Pt reports ABD pain. Pt denies fevers. Pt is a type 1 diabetic. Per pt, his blood glucose have not been elevated.

## 2023-11-03 NOTE — ED Provider Notes (Signed)
[facility] Provider Note    Event Date/Time   First MD Initiated Contact with Patient 11/03/23 1834     (approximate)   History   Emesis   HPI  Vincent Morgan is a 32 y.o. male with a history of cyclic vomiting syndrome and type 1 diabetes presents to the ER for evaluation of nausea vomiting crampy abdominal pain that started earlier this morning.  Blood sugars have been running high has been compliant with his medications.  Does have several members sick with symptoms over the past few days.  Denies any cough or congestion.     Physical Exam   Triage Vital Signs: ED Triage Vitals  Encounter Vitals Group     BP 11/03/23 1731 132/84     Systolic BP Percentile --      Diastolic BP Percentile --      Pulse Rate 11/03/23 1731 98     Resp 11/03/23 1731 20     Temp 11/03/23 1731 98.2 F (36.8 C)     Temp Source 11/03/23 1731 Oral     SpO2 11/03/23 1731 99 %     Weight 11/03/23 1729 130 lb (59 kg)     Height --      Head Circumference --      Peak Flow --      Pain Score 11/03/23 1730 8     Pain Loc --      Pain Education --      Exclude from Growth Chart --     Most recent vital signs: Vitals:   11/03/23 1731  BP: 132/84  Pulse: 98  Resp: 20  Temp: 98.2 F (36.8 C)  SpO2: 99%     Constitutional: Alert  Eyes: Conjunctivae are normal.  Head: Atraumatic. Nose: No congestion/rhinnorhea. Mouth/Throat: Mucous membranes are moist.   Neck: Painless ROM.  Cardiovascular:   Good peripheral circulation. Respiratory: Normal respiratory effort.  No retractions.  Gastrointestinal: Soft and nontender in all four quadrants Musculoskeletal:  no deformity Neurologic:  MAE spontaneously. No gross focal neurologic deficits are appreciated.  Skin:  Skin is warm, dry and intact. No rash noted. Psychiatric: Mood and affect are normal. Speech and behavior are normal.    ED Results / Procedures / Treatments   Labs (all labs ordered are listed, but  only abnormal results are displayed) Labs Reviewed  COMPREHENSIVE METABOLIC PANEL - Abnormal; Notable for the following components:      Result Value   Chloride 97 (*)    Glucose, Bld 229 (*)    Creatinine, Ser 0.56 (*)    All other components within normal limits  CBC - Abnormal; Notable for the following components:   WBC 10.7 (*)    Platelets 404 (*)    All other components within normal limits  URINALYSIS, ROUTINE W REFLEX MICROSCOPIC - Abnormal; Notable for the following components:   Color, Urine YELLOW (*)    APPearance CLOUDY (*)    Specific Gravity, Urine 1.040 (*)    Glucose, UA >=500 (*)    Ketones, ur 20 (*)    All other components within normal limits  CBG MONITORING, ED - Abnormal; Notable for the following components:   Glucose-Capillary 209 (*)    All other components within normal limits  RESP PANEL BY RT-PCR (RSV, FLU A&B, COVID)  RVPGX2  LIPASE, BLOOD  CBG MONITORING, ED     EKG     RADIOLOGY    PROCEDURES:  Critical Care performed:  No  Procedures   MEDICATIONS ORDERED IN ED: Medications  morphine (PF) 4 MG/ML injection 4 mg (4 mg Intravenous Given 11/03/23 1852)  sodium chloride 0.9 % bolus 1,000 mL (1,000 mLs Intravenous New Bag/Given 11/03/23 1852)  ondansetron (ZOFRAN) injection 4 mg (4 mg Intravenous Given 11/03/23 1852)     IMPRESSION / MDM / ASSESSMENT AND PLAN / ED COURSE  I reviewed the triage vital signs and the nursing notes.                              Differential diagnosis includes, but is not limited to, neuritis, gastritis, biliary pathology, pancreatitis, appendicitis, cyclic vomiting syndrome, foodborne illness  Patient presenting to the ER for evaluation of symptoms as described above.  Based on symptoms, risk factors and considered above differential, this presenting complaint could reflect a potentially life-threatening illness therefore the patient will be placed on continuous pulse oximetry and telemetry for  monitoring.  Laboratory evaluation will be sent to evaluate for the above complaints.  Abdominal exam is without guarding or rebound.  Has been seen for similar symptoms of the past thought to have symptoms secondary to cannabinoid hyperemesis syndrome.  Will give IV fluids check blood work and observe.  Do not feel that CT imaging clinically indicated to have a low suspicion for appendicitis or biliary pathology based on exam   Clinical Course as of 11/03/23 2039  [facility] Nov 03, 2023  1930 Patient reassessed.  Repeat abdominal exam is soft with no 1.  Is not consistent with appendicitis or colitis.  Do not feel that CT imaging clinically indicated given otherwise reassuring workup lack of fever lack of tachycardia [PR]  2038 Patient tolerating p.o.  Feels improved.  Repeat abdominal exam remains benign.  He does appear stable and appropriate for outpatient follow-up. [PR]    Clinical Course User Index [PR] Willy Eddy, MD     FINAL CLINICAL IMPRESSION(S) / ED DIAGNOSES   Final diagnoses:  Nausea and vomiting, unspecified vomiting type     Rx / DC Orders   ED Discharge Orders          Ordered    promethazine (PHENERGAN) 25 MG tablet  Every 6 hours PRN        11/03/23 2038             Note:  This document was prepared using Dragon voice recognition software and may include unintentional dictation errors.    Willy Eddy, MD 11/03/23 2040

## 2023-11-10 ENCOUNTER — Other Ambulatory Visit: Payer: Self-pay

## 2023-11-10 ENCOUNTER — Emergency Department: Payer: 59

## 2023-11-10 ENCOUNTER — Encounter: Payer: Self-pay | Admitting: Emergency Medicine

## 2023-11-10 ENCOUNTER — Emergency Department
Admission: EM | Admit: 2023-11-10 | Discharge: 2023-11-10 | Disposition: A | Discharge status: Home / Self Care | Payer: 59 | Attending: Emergency Medicine | Admitting: Emergency Medicine

## 2023-11-10 DIAGNOSIS — K828 Other specified diseases of gallbladder: Secondary | ICD-10-CM | POA: Diagnosis not present

## 2023-11-10 DIAGNOSIS — E109 Type 1 diabetes mellitus without complications: Secondary | ICD-10-CM | POA: Diagnosis not present

## 2023-11-10 DIAGNOSIS — R1115 Cyclical vomiting syndrome unrelated to migraine: Secondary | ICD-10-CM

## 2023-11-10 DIAGNOSIS — K838 Other specified diseases of biliary tract: Secondary | ICD-10-CM

## 2023-11-10 DIAGNOSIS — R112 Nausea with vomiting, unspecified: Secondary | ICD-10-CM | POA: Diagnosis present

## 2023-11-10 LAB — COMPREHENSIVE METABOLIC PANEL
ALT: 14 U/L (ref 0–44)
AST: 14 U/L — ABNORMAL LOW (ref 15–41)
Albumin: 4.3 g/dL (ref 3.5–5.0)
Alkaline Phosphatase: 69 U/L (ref 38–126)
Anion gap: 12 (ref 5–15)
BUN: 10 mg/dL (ref 6–20)
CO2: 23 mmol/L (ref 22–32)
Calcium: 9.1 mg/dL (ref 8.9–10.3)
Chloride: 98 mmol/L (ref 98–111)
Creatinine, Ser: 0.57 mg/dL — ABNORMAL LOW (ref 0.61–1.24)
GFR, Estimated: >60 mL/min (ref >60)
Glucose, Bld: 282 mg/dL — ABNORMAL HIGH (ref 70–99)
Potassium: 3.7 mmol/L (ref 3.5–5.1)
Sodium: 133 mmol/L — ABNORMAL LOW (ref 135–145)
Total Bilirubin: 0.9 mg/dL (ref <1.2)
Total Protein: 6.9 g/dL (ref 6.5–8.1)

## 2023-11-10 LAB — CBC
HCT: 42.5 % (ref 39.0–52.0)
Hemoglobin: 15.2 g/dL (ref 13.0–17.0)
MCH: 32.3 pg (ref 26.0–34.0)
MCHC: 35.8 g/dL (ref 30.0–36.0)
MCV: 90.2 fL (ref 80.0–100.0)
Platelets: 392 10*3/uL (ref 150–400)
RBC: 4.71 MIL/uL (ref 4.22–5.81)
RDW: 11.8 % (ref 11.5–15.5)
WBC: 12.1 10*3/uL — ABNORMAL HIGH (ref 4.0–10.5)
nRBC: 0 % (ref 0.0–0.2)

## 2023-11-10 LAB — URINALYSIS, ROUTINE W REFLEX MICROSCOPIC
Bacteria, UA: NONE SEEN
Bilirubin Urine: NEGATIVE
Glucose, UA: >=500 mg/dL — AB
Hgb urine dipstick: NEGATIVE
Ketones, ur: 80 mg/dL — AB
Leukocytes,Ua: NEGATIVE
Nitrite: NEGATIVE
Protein, ur: NEGATIVE mg/dL
Specific Gravity, Urine: 1.04 — ABNORMAL HIGH (ref 1.005–1.030)
pH: 6 (ref 5.0–8.0)

## 2023-11-10 LAB — TROPONIN I (HIGH SENSITIVITY): Troponin I (High Sensitivity): 3 ng/L (ref <18)

## 2023-11-10 LAB — LIPASE, BLOOD: Lipase: 25 U/L (ref 11–51)

## 2023-11-10 MED ORDER — SODIUM CHLORIDE 0.9 % IV BOLUS
1000.0000 mL | Freq: Once | INTRAVENOUS | Status: AC
  Administered 2023-11-10: 1000 mL via INTRAVENOUS

## 2023-11-10 MED ORDER — SUCRALFATE 1 G PO TABS: 1.0000 g | ORAL_TABLET | Freq: Three times a day (TID) | ORAL | 0 refills | Status: DC

## 2023-11-10 MED ORDER — PANTOPRAZOLE SODIUM 40 MG IV SOLR
40.0000 mg | Freq: Once | INTRAVENOUS | Status: AC
  Administered 2023-11-10: 40 mg via INTRAVENOUS
  Filled 2023-11-10: qty 10

## 2023-11-10 MED ORDER — FENTANYL CITRATE PF 50 MCG/ML IJ SOSY
50.0000 ug | PREFILLED_SYRINGE | Freq: Once | INTRAMUSCULAR | Status: DC
  Filled 2023-11-10: qty 1

## 2023-11-10 MED ORDER — METOCLOPRAMIDE HCL 5 MG/ML IJ SOLN
10.0000 mg | Freq: Once | INTRAMUSCULAR | Status: AC
  Administered 2023-11-10: 10 mg via INTRAVENOUS
  Filled 2023-11-10: qty 2

## 2023-11-10 MED ORDER — DROPERIDOL 2.5 MG/ML IJ SOLN
1.2500 mg | Freq: Once | INTRAMUSCULAR | Status: AC
  Administered 2023-11-10: 1.25 mg via INTRAVENOUS
  Filled 2023-11-10: qty 2

## 2023-11-10 MED ORDER — PANTOPRAZOLE SODIUM 40 MG PO TBEC: 40.0000 mg | DELAYED_RELEASE_TABLET | Freq: Every day | ORAL | 0 refills | Status: DC

## 2023-11-10 MED ORDER — PROMETHAZINE HCL 12.5 MG RE SUPP: 12.5000 mg | Freq: Four times a day (QID) | RECTAL | 0 refills | Status: DC | PRN

## 2023-11-10 NOTE — ED Provider Notes (Addendum)
[facility] Provider Note    None    (approximate)   History   Abdominal Pain   HPI  Vincent Morgan is a 32 y.o. male  WITH recurrent history of cyclic vomiting, nausea vomiting who comes in with similar symptoms.  Patient does have type 1 diabetes.  He does report still using marijuana.  He reports having a CT scan done 2 days ago at [facility] that was negative.  He reports epigastric discomfort as well. NO prior imaging of gallbladder.   Physical Exam   Triage Vital Signs: ED Triage Vitals  Encounter Vitals Group     BP 11/10/23 0839 (!) 139/97     Systolic BP Percentile --      Diastolic BP Percentile --      Pulse Rate 11/10/23 0839 82     Resp 11/10/23 0839 18     Temp 11/10/23 0839 97.7 F (36.5 C)     Temp Source 11/10/23 0839 Oral     SpO2 11/10/23 0839 95 %     Weight 11/10/23 0837 129 lb 13.6 oz (58.9 kg)     Height --      Head Circumference --      Peak Flow --      Pain Score 11/10/23 0836 9     Pain Loc --      Pain Education --      Exclude from Growth Chart --     Most recent vital signs: Vitals:   11/10/23 0839  BP: (!) 139/97  Pulse: 82  Resp: 18  Temp: 97.7 F (36.5 C)  SpO2: 95%     General: Awake, no distress.  CV:  Good peripheral perfusion.  Resp:  Normal effort.  Abd:  No distention. Tender epigastric Other:  No crepitus, no rebound no guarding.   ED Results / Procedures / Treatments   Labs (all labs ordered are listed, but only abnormal results are displayed) Labs Reviewed  COMPREHENSIVE METABOLIC PANEL - Abnormal; Notable for the following components:      Result Value   Sodium 133 (*)    Glucose, Bld 282 (*)    Creatinine, Ser 0.57 (*)    AST 14 (*)    All other components within normal limits  CBC - Abnormal; Notable for the following components:   WBC 12.1 (*)    All other components within normal limits  LIPASE, BLOOD  URINALYSIS, ROUTINE W REFLEX MICROSCOPIC     EKG  My  interpretation of EKG:  Normal sinus rate of 87 without any ST elevation, T wave version lead III, QTc of 464  RADIOLOGY I reviewed the ultrasound personally interpreted and no gallstones  IMPRESSION: 1. Minimal gallbladder sludge. 2. No acute sonographic findings within the RIGHT upper quadrant.    PROCEDURES:  Critical Care performed: No  Procedures   MEDICATIONS ORDERED IN ED: Medications  fentaNYL (SUBLIMAZE) injection 50 mcg (has no administration in time range)  droperidol (INAPSINE) 2.5 MG/ML injection 1.25 mg (has no administration in time range)  pantoprazole (PROTONIX) injection 40 mg (has no administration in time range)  metoCLOPramide (REGLAN) injection 10 mg (10 mg Intravenous Given 11/10/23 0856)  sodium chloride 0.9 % bolus 1,000 mL (1,000 mLs Intravenous New Bag/Given 11/10/23 0857)     IMPRESSION / MDM / ASSESSMENT AND PLAN / ED COURSE  I reviewed the triage vital signs and the nursing notes.   Patient's presentation is most consistent with acute presentation with  potential threat to life or bodily function.   Patient comes in with significant nausea vomiting and abdominal discomfort recent CT imaging done 2 days ago was negative discussed with mom that recurrent CT images can be harmful and they have agreed to hold off on recurrent CT imaging.  Do not see evaluated ultrasound of his gallbladder so we can proceed with gallbladder ultrasound to make sure no gallstones being missed on CT imaging however I suspect this is more likely cyclic vomiting syndrome, gastroparesis.  We discussed cessation of marijuana use.  His workup shows slightly elevated white count lipase normal CMP shows no evidence of DKA.  Patient was given some Reglan, droperidol, Protonix.  Patient feeling better after medications.  Tolerated p.o.  Does report some dysuria so trying to get a urine prior to discharge.  Discussed with them that the ultrasound does show some small amount of sludge  in it and sometimes this can cause some symptoms of biliary colic.  No signs of cholecystitis that patient needs to have this taken out emergently.  On repeat abdominal exam he is got no epigastric or right upper quadrant pain.  Discussed with patient and mom and they do not want to jump to having his gallbladder out but they are willing to see surgery outpatient.  We discussed admission but patient is tolerating p.o. and will follow-up outpatient.  We discussed the importance of cessation of marijuana as this is always going to be contributory to his symptoms until we have fully taken this out of the mixture and better tell if this is related to his gallbladder or not.  Ua NEGATIVEfor UTI.  Repeat abdominal exam is soft and nontender and patient feels comfortable with discharge home   Will cover patient for possible gastritis as well as do some rectal Phenergan given his QTc is normal at this time.  Patient expressed understanding and felt comfortable with discharge home.  They report that they were already referred to [facility] GI but his insurance does not cover it so the mom's can work on the primary care referring him to [facility] instead.    FINAL CLINICAL IMPRESSION(S) / ED DIAGNOSES   Final diagnoses:  Cyclic vomiting syndrome  Biliary sludge     Rx / DC Orders   ED Discharge Orders          Ordered    promethazine (PHENERGAN) 12.5 MG suppository  Every 6 hours PRN        11/10/23 1509    pantoprazole (PROTONIX) 40 MG tablet  Daily        11/10/23 1509    sucralfate (CARAFATE) 1 g tablet  3 times daily with meals & bedtime        11/10/23 1509             Note:  This document was prepared using Dragon voice recognition software and may include unintentional dictation errors.   Concha Se, MD 11/10/23 1240    Concha Se, MD 11/10/23 [phone]

## 2023-11-10 NOTE — ED Triage Notes (Signed)
C/O abd pain.  Pain has been ongoing.  Seen 12/22 for same.  Patient with some vomiting, but tolerating water.  Patient is emotionally distressed in triage.

## 2023-11-10 NOTE — Discharge Instructions (Addendum)
Discontinue marijuana use as this could be contributing to symptoms.  You can take Tylenol 1 g every 8 hours to help with pain.  Protonix if there is any signs of acid and Carafate to help find the stomach.  I have also prescribe some Phenergan that you can use rectally to try to help with any nausea or vomiting.  We discussed your ultrasound showed gallbladder sludge and this can be followed up with Dr. Everlene Farrier outpatient.  Return to the ER if you develop worsening symptoms or any other concerns.  Return for fevers, worsening pain or any other concerns

## 2023-12-19 ENCOUNTER — Emergency Department
Admission: EM | Admit: 2023-12-19 | Discharge: 2023-12-19 | Disposition: A | Discharge status: Home / Self Care | Payer: 59 | Attending: Emergency Medicine | Admitting: Emergency Medicine

## 2023-12-19 ENCOUNTER — Other Ambulatory Visit: Payer: Self-pay

## 2023-12-19 DIAGNOSIS — R112 Nausea with vomiting, unspecified: Secondary | ICD-10-CM | POA: Diagnosis present

## 2023-12-19 DIAGNOSIS — E119 Type 2 diabetes mellitus without complications: Secondary | ICD-10-CM | POA: Diagnosis not present

## 2023-12-19 DIAGNOSIS — R1084 Generalized abdominal pain: Secondary | ICD-10-CM | POA: Diagnosis not present

## 2023-12-19 DIAGNOSIS — Z20822 Contact with and (suspected) exposure to covid-19: Secondary | ICD-10-CM | POA: Diagnosis not present

## 2023-12-19 LAB — CBC
HCT: 45 % (ref 39.0–52.0)
Hemoglobin: 15.9 g/dL (ref 13.0–17.0)
MCH: 31.7 pg (ref 26.0–34.0)
MCHC: 35.3 g/dL (ref 30.0–36.0)
MCV: 89.8 fL (ref 80.0–100.0)
Platelets: 403 10*3/uL — ABNORMAL HIGH (ref 150–400)
RBC: 5.01 MIL/uL (ref 4.22–5.81)
RDW: 11.9 % (ref 11.5–15.5)
WBC: 14.1 10*3/uL — ABNORMAL HIGH (ref 4.0–10.5)
nRBC: 0 % (ref 0.0–0.2)

## 2023-12-19 LAB — COMPREHENSIVE METABOLIC PANEL
ALT: 23 U/L (ref 0–44)
AST: 21 U/L (ref 15–41)
Albumin: 4.5 g/dL (ref 3.5–5.0)
Alkaline Phosphatase: 72 U/L (ref 38–126)
Anion gap: 17 — ABNORMAL HIGH (ref 5–15)
BUN: 13 mg/dL (ref 6–20)
CO2: 24 mmol/L (ref 22–32)
Calcium: 9.8 mg/dL (ref 8.9–10.3)
Chloride: 97 mmol/L — ABNORMAL LOW (ref 98–111)
Creatinine, Ser: 0.67 mg/dL (ref 0.61–1.24)
GFR, Estimated: >60 mL/min (ref >60)
Glucose, Bld: 301 mg/dL — ABNORMAL HIGH (ref 70–99)
Potassium: 3.8 mmol/L (ref 3.5–5.1)
Sodium: 138 mmol/L (ref 135–145)
Total Bilirubin: 0.6 mg/dL (ref 0.0–1.2)
Total Protein: 7.5 g/dL (ref 6.5–8.1)

## 2023-12-19 LAB — RESP PANEL BY RT-PCR (RSV, FLU A&B, COVID)  RVPGX2
Influenza A by PCR: NEGATIVE
Influenza B by PCR: NEGATIVE
Resp Syncytial Virus by PCR: NEGATIVE
SARS Coronavirus 2 by RT PCR: NEGATIVE

## 2023-12-19 LAB — LIPASE, BLOOD: Lipase: 22 U/L (ref 11–51)

## 2023-12-19 LAB — CBG MONITORING, ED: Glucose-Capillary: 256 mg/dL — ABNORMAL HIGH (ref 70–99)

## 2023-12-19 MED ORDER — LACTATED RINGERS IV BOLUS
1000.0000 mL | Freq: Once | INTRAVENOUS | Status: AC
  Administered 2023-12-19: 1000 mL via INTRAVENOUS

## 2023-12-19 MED ORDER — DROPERIDOL 2.5 MG/ML IJ SOLN
5.0000 mg | Freq: Once | INTRAMUSCULAR | Status: AC
  Administered 2023-12-19: 5 mg via INTRAMUSCULAR
  Filled 2023-12-19: qty 2

## 2023-12-19 NOTE — ED Provider Notes (Signed)
 Tolerating p.o. intake, couple cups of apple juice, suitable for outpatient management per original plan of care.   Arline Bennett, MD 12/19/23 1728

## 2023-12-19 NOTE — ED Triage Notes (Addendum)
 Pt to Ed via POV from home. Pt reports N/V, cough, HA and abd pain x2 days. Pt also reports increased anxiety and feels he is dehydrated. Pt rocking back and forth in triage. Pt very soft spoken. Pt denies urinary symptoms.

## 2023-12-19 NOTE — ED Provider Triage Note (Signed)
 Emergency Medicine Provider Triage Evaluation Note  Vincent Morgan , a 33 y.o. male  was evaluated in triage.  Pt complains of N/V/D x 2 days with generalized abdominal pain. States his family called EMS. He reports he has been experiencing increased anxiety and stress.  Review of Systems  Positive: Headache, cough Negative: Chest pain, SOB  Physical Exam  BP (!) 139/97   Pulse (!) 108   Temp 98 F (36.7 C) (Oral)   Resp 20   SpO2 98%  Gen:   Awake, no distress  anxious Resp:  Normal effort LCTAB MSK:   Moves extremities without difficulty  Other:  Generalized mild abdominal tenderness to palpation   Medical Decision Making  Medically screening exam initiated at 11:58 AM.  Appropriate orders placed.  Vincent Morgan was informed that the remainder of the evaluation will be completed by another provider, this initial triage assessment does not replace that evaluation, and the importance of remaining in the ED until their evaluation is complete.     Margrette, Graceland Wachter A, PA-C 12/19/23 1201

## 2023-12-19 NOTE — ED Provider Notes (Signed)
 [facility] Provider Note    Event Date/Time   First MD Initiated Contact with Patient 12/19/23 1427     (approximate)   History   Emesis   HPI  Vincent Morgan is a 33 y.o. male with history of diabetes, cyclic vomiting, presenting with nausea vomiting.  His abdominal pain is generalized, he denies any diarrhea or urinary symptoms, no fever.  He is asking for something to drink and says that he is dehydrated.  Denies any chest pain or shortness of breath, no cough.  Denies any marijuana use.  States he has been taking his medications.   On independent chart review he had an ultrasound done in December, that showed minimal gallbladder sludge but no other acute sonographic findings.  Had a CT done in September of last year that showed hepatic steatosis, gallbladder is unremarkable.  Does appear that he has been seen multiple times for cyclic vomiting.  Physical Exam   Triage Vital Signs: ED Triage Vitals  Encounter Vitals Group     BP 12/19/23 1152 (!) 139/97     Systolic BP Percentile --      Diastolic BP Percentile --      Pulse Rate 12/19/23 1152 (!) 108     Resp 12/19/23 1152 20     Temp 12/19/23 1152 98 F (36.7 C)     Temp Source 12/19/23 1152 Oral     SpO2 12/19/23 1152 98 %     Weight --      Height --      Head Circumference --      Peak Flow --      Pain Score 12/19/23 1153 8     Pain Loc --      Pain Education --      Exclude from Growth Chart --     Most recent vital signs: Vitals:   12/19/23 1152 12/19/23 1502  BP: (!) 139/97 (!) 142/83  Pulse: (!) 108 84  Resp: 20 18  Temp: 98 F (36.7 C) 98.3 F (36.8 C)  SpO2: 98% 99%     General: Awake, no distress.  CV:  Good peripheral perfusion.  Tachycardic Resp:  Normal effort.  Abd:  No distention.  Soft nontender Other:  Moving  4 extremities   ED Results / Procedures / Treatments   Labs (all labs ordered are listed, but only abnormal results are displayed) Labs  Reviewed  COMPREHENSIVE METABOLIC PANEL - Abnormal; Notable for the following components:      Result Value   Chloride 97 (*)    Glucose, Bld 301 (*)    Anion gap 17 (*)    All other components within normal limits  CBC - Abnormal; Notable for the following components:   WBC 14.1 (*)    Platelets 403 (*)    All other components within normal limits  RESP PANEL BY RT-PCR (RSV, FLU A&B, COVID)  RVPGX2  LIPASE, BLOOD  URINALYSIS, ROUTINE W REFLEX MICROSCOPIC     EKG  Sinus rhythm, rate 94, normal QRS, QTc is 467, he is T wave flattening to 1, aVL, V2, otherwise no ischemic ST elevation, not significant change compared to prior   PROCEDURES:  Critical Care performed: No  Procedures   MEDICATIONS ORDERED IN ED: Medications  lactated ringers  bolus 1,000 mL (1,000 mLs Intravenous New Bag/Given 12/19/23 1436)  droperidol  (INAPSINE ) 2.5 MG/ML injection 5 mg (5 mg Intramuscular Given 12/19/23 1451)     IMPRESSION / MDM /  ASSESSMENT AND PLAN / ED COURSE  I reviewed the triage vital signs and the nursing notes.                              Differential diagnosis includes, but is not limited to, gastroparesis, cyclic vomiting syndrome, gastroenteritis, norovirus, food poisoning, influenza, electrolyte derangements, dehydration.  Will get labs, respiratory viral panel, get EKG to assess his QTc.  Will give him some IV fluids.  Patient's presentation is most consistent with acute presentation with potential threat to life or bodily function.  EKG not prolonged.  Will give him some IM droperidol  for his symptoms.  He is also getting IV fluids.  Independent review of last labs, he is mild leukocytosis but this is nonspecific, could be reactive, he has no other infectious symptoms.  Lipase is normal, electrolytes not severely deranged, his glucose is mildly elevated but bicarb is normal.  Creatinine is normal, LFTs are normal.  Patient signed out to oncoming team pending reassessment after  medications.  Tolerating p.o., likely will be discharged.     FINAL CLINICAL IMPRESSION(S) / ED DIAGNOSES   Final diagnoses:  Nausea and vomiting, unspecified vomiting type  Generalized abdominal pain     Rx / DC Orders   ED Discharge Orders     None        Note:  This document was prepared using Dragon voice recognition software and may include unintentional dictation errors.    Waymond Lorelle Cummins, MD 12/19/23 [phone]

## 2023-12-19 NOTE — ED Notes (Signed)
 First Nurse Note: Pt to ED via [facility] from home for N/V. CBG 274- pt has hx/o DM.

## 2023-12-19 NOTE — ED Notes (Signed)
 Pt had one episode of vomit aprox 400 ml. New emesis bag provided.

## 2024-02-01 ENCOUNTER — Emergency Department

## 2024-02-01 ENCOUNTER — Emergency Department
Admission: EM | Admit: 2024-02-01 | Discharge: 2024-02-01 | Disposition: A | Discharge status: Home / Self Care | Attending: Emergency Medicine | Admitting: Emergency Medicine

## 2024-02-01 ENCOUNTER — Other Ambulatory Visit: Payer: Self-pay

## 2024-02-01 DIAGNOSIS — D72829 Elevated white blood cell count, unspecified: Secondary | ICD-10-CM | POA: Diagnosis not present

## 2024-02-01 DIAGNOSIS — R1115 Cyclical vomiting syndrome unrelated to migraine: Secondary | ICD-10-CM | POA: Diagnosis not present

## 2024-02-01 DIAGNOSIS — R112 Nausea with vomiting, unspecified: Secondary | ICD-10-CM | POA: Diagnosis present

## 2024-02-01 DIAGNOSIS — E109 Type 1 diabetes mellitus without complications: Secondary | ICD-10-CM | POA: Diagnosis not present

## 2024-02-01 LAB — COMPREHENSIVE METABOLIC PANEL
ALT: 23 U/L (ref 0–44)
AST: 23 U/L (ref 15–41)
Albumin: 4.8 g/dL (ref 3.5–5.0)
Alkaline Phosphatase: 82 U/L (ref 38–126)
Anion gap: 10 (ref 5–15)
BUN: 14 mg/dL (ref 6–20)
CO2: 25 mmol/L (ref 22–32)
Calcium: 9.9 mg/dL (ref 8.9–10.3)
Chloride: 102 mmol/L (ref 98–111)
Creatinine, Ser: 0.57 mg/dL — ABNORMAL LOW (ref 0.61–1.24)
GFR, Estimated: >60 mL/min (ref >60)
Glucose, Bld: 312 mg/dL — ABNORMAL HIGH (ref 70–99)
Potassium: 3.6 mmol/L (ref 3.5–5.1)
Sodium: 137 mmol/L (ref 135–145)
Total Bilirubin: 1.1 mg/dL (ref 0.0–1.2)
Total Protein: 8.3 g/dL — ABNORMAL HIGH (ref 6.5–8.1)

## 2024-02-01 LAB — CBC
HCT: 44.9 % (ref 39.0–52.0)
Hemoglobin: 16.3 g/dL (ref 13.0–17.0)
MCH: 31.8 pg (ref 26.0–34.0)
MCHC: 36.3 g/dL — ABNORMAL HIGH (ref 30.0–36.0)
MCV: 87.7 fL (ref 80.0–100.0)
Platelets: 483 10*3/uL — ABNORMAL HIGH (ref 150–400)
RBC: 5.12 MIL/uL (ref 4.22–5.81)
RDW: 11.7 % (ref 11.5–15.5)
WBC: 24.8 10*3/uL — ABNORMAL HIGH (ref 4.0–10.5)
nRBC: 0 % (ref 0.0–0.2)

## 2024-02-01 LAB — URINALYSIS, ROUTINE W REFLEX MICROSCOPIC
Bacteria, UA: NONE SEEN
Bilirubin Urine: NEGATIVE
Glucose, UA: >=500 mg/dL — AB
Hgb urine dipstick: NEGATIVE
Ketones, ur: 80 mg/dL — AB
Leukocytes,Ua: NEGATIVE
Nitrite: NEGATIVE
Protein, ur: NEGATIVE mg/dL
Specific Gravity, Urine: >1.046 — ABNORMAL HIGH (ref 1.005–1.030)
pH: 5 (ref 5.0–8.0)

## 2024-02-01 LAB — LIPASE, BLOOD: Lipase: 24 U/L (ref 11–51)

## 2024-02-01 LAB — CBG MONITORING, ED: Glucose-Capillary: 198 mg/dL — ABNORMAL HIGH (ref 70–99)

## 2024-02-01 LAB — LACTIC ACID, PLASMA
Lactic Acid, Venous: 3.1 mmol/L (ref 0.5–1.9)
Lactic Acid, Venous: 2.1 mmol/L (ref 0.5–1.9)
Lactic Acid, Venous: 1.1 mmol/L (ref 0.5–1.9)

## 2024-02-01 MED ORDER — SODIUM CHLORIDE 0.9 % IV BOLUS
1000.0000 mL | Freq: Once | INTRAVENOUS | Status: AC
  Administered 2024-02-01: 1000 mL via INTRAVENOUS

## 2024-02-01 MED ORDER — PANTOPRAZOLE SODIUM 40 MG IV SOLR
40.0000 mg | Freq: Once | INTRAVENOUS | Status: AC
  Administered 2024-02-01: 40 mg via INTRAVENOUS
  Filled 2024-02-01: qty 10

## 2024-02-01 MED ORDER — LACTATED RINGERS IV BOLUS
1000.0000 mL | Freq: Once | INTRAVENOUS | Status: AC
  Administered 2024-02-01: 1000 mL via INTRAVENOUS

## 2024-02-01 MED ORDER — DROPERIDOL 2.5 MG/ML IJ SOLN
1.2500 mg | Freq: Once | INTRAMUSCULAR | Status: AC
  Administered 2024-02-01: 1.25 mg via INTRAVENOUS
  Filled 2024-02-01: qty 2

## 2024-02-01 MED ORDER — IOHEXOL 300 MG/ML  SOLN
100.0000 mL | Freq: Once | INTRAMUSCULAR | Status: AC | PRN
  Administered 2024-02-01: 75 mL via INTRAVENOUS

## 2024-02-01 NOTE — ED Triage Notes (Signed)
 Pt c/o nausea & vomiting since 2am today. Denies any sick contacts. C/o abd pain; rates it 8/10.

## 2024-02-01 NOTE — ED Provider Notes (Signed)
 [facility] Provider Note    Event Date/Time   First MD Initiated Contact with Patient 02/01/24 [phone]     (approximate)   History   Nausea and Emesis   HPI  Vincent Morgan is a 33 y.o. male with diabetes, cyclic vomiting, nausea, vomiting who comes in with concerns for nausea and vomiting.  Patient is a type I diabetic on insulin who comes in with concerns for nausea and vomiting.  According to patient's mom he had a slight cough for the past 3 weeks.  She decided to give him some cough medicine yesterday.  This morning he woke up around 2 AM with diffuse nausea, vomiting, nonbloody in nature.  He reported severe abdominal discomfort more in the upper abdomen.  Denies any sick contacts.  States that this seems similar to when he has been here previously.  He never followed up for the biliary sludge noted on ultrasound.   Physical Exam   Triage Vital Signs: ED Triage Vitals  Encounter Vitals Group     BP 02/01/24 0913 (!) 125/92     Systolic BP Percentile --      Diastolic BP Percentile --      Pulse Rate 02/01/24 0913 (!) 134     Resp 02/01/24 0913 20     Temp 02/01/24 0913 98.1 F (36.7 C)     Temp Source 02/01/24 0913 Oral     SpO2 02/01/24 0913 99 %     Weight 02/01/24 0914 145 lb (65.8 kg)     Height 02/01/24 0914 6\' 2"  (1.88 m)     Head Circumference --      Peak Flow --      Pain Score 02/01/24 0914 8     Pain Loc --      Pain Education --      Exclude from Growth Chart --     Most recent vital signs: Vitals:   02/01/24 0913  BP: (!) 125/92  Pulse: (!) 134  Resp: 20  Temp: 98.1 F (36.7 C)  SpO2: 99%     General: Awake, no distress.  CV:  Good peripheral perfusion.  No obvious crepitus Resp:  Normal effort.  Abd:  No distention.  Tender in the upper abdomen without rebound, guarding Other:     ED Results / Procedures / Treatments   Labs (all labs ordered are listed, but only abnormal results are displayed) Labs Reviewed   CBC - Abnormal; Notable for the following components:      Result Value   WBC 24.8 (*)    MCHC 36.3 (*)    Platelets 483 (*)    All other components within normal limits  LIPASE, BLOOD  COMPREHENSIVE METABOLIC PANEL  URINALYSIS, ROUTINE W REFLEX MICROSCOPIC     EKG  My interpretation of EKG:  Sinus tachycardia rate of 115 without any ST elevation or T wave inversions, QTc is 472  RADIOLOGY I have reviewed the CT personally and interpreted and no appendicitis   PROCEDURES:  Critical Care performed: No  Procedures   MEDICATIONS ORDERED IN ED: Medications  iohexol (OMNIPAQUE) 300 MG/ML solution 100 mL (has no administration in time range)  sodium chloride 0.9 % bolus 1,000 mL (1,000 mLs Intravenous New Bag/Given 02/01/24 1019)  pantoprazole (PROTONIX) injection 40 mg (40 mg Intravenous Given 02/01/24 1014)  droperidol (INAPSINE) 2.5 MG/ML injection 1.25 mg (1.25 mg Intravenous Given 02/01/24 1021)     IMPRESSION / MDM / ASSESSMENT AND PLAN /  ED COURSE  I reviewed the triage vital signs and the nursing notes.   Patient's presentation is most consistent with acute presentation with potential threat to life or bodily function.   Patient comes in with nausea vomiting.  Patient meets sepsis criteria with elevated white count and elevated heart rate.  At this time no source for infection we will hold off on antibiotics.  I suspect that the elevated white count is reactive from the nausea and vomiting but typically when he comes in his white count is not this high.  Given how uncomfortable patient.  We discussed CT imaging to ensure there is no other acute pathology given the change in patient's white count.  He denies any overt chest pain, crepitus but he had severe epigastric pain therefore we will get CT chest as well as CT abdomen to rule out acute pathology such as esophageal perforation, ulcer perforation, appendicitis, cholecystitis.  CBC shows elevated white count of  24.8 Lactate elevated at 3.1 Lipase is normal CMP shows elevated sugar of 312 with no anion gap  11:20 AM patient feeling improved after droperidol.  12:53 PM patient is resting comfortably in bed.  Heart rates are still in the low 100s.  Lactate is downtrending but is not normalized.  Discussed with patient's mom admission versus going home they stated that he usually does well just going home after being in the emergency room.  Patient continuing finishing his fluid only on 1 L after the first lactate we will get a second lactate after this fluid is completed, p.o. challenge and see if they still feel comfortable with going home if symptoms have resolved.  2:38 PM patient reports doing well.  Tolerating p.o. they report having some antinausea medicine at home.  Offered patient admission but patient declined patient feels comfortable with discharge home  The patient is on the cardiac monitor to evaluate for evidence of arrhythmia and/or significant heart rate changes.      FINAL CLINICAL IMPRESSION(S) / ED DIAGNOSES   Final diagnoses:  Cyclic vomiting syndrome     Rx / DC Orders   ED Discharge Orders     None        Note:  This document was prepared using Dragon voice recognition software and may include unintentional dictation errors.   Concha Se, MD 02/01/24 [phone]

## 2024-02-01 NOTE — Discharge Instructions (Addendum)
 Your white count was still elevated your blood cultures are pending.  They will call you if there is any abnormality if you develop any fevers, worsening symptoms can return to the ER.   You can still call the surgeon to make a follow-up appointment for the gallbladder given there was a gallbladder sludge that we noted when I last saw him however I do suspect this is more likely related to his cyclic vomiting from his diabetes.

## 2024-02-06 LAB — CULTURE, BLOOD (ROUTINE X 2)
Culture: NO GROWTH
Culture: NO GROWTH
Special Requests: ADEQUATE
Special Requests: ADEQUATE

## 2024-04-08 ENCOUNTER — Emergency Department: Admission: EM | Admit: 2024-04-08 | Discharge: 2024-04-08 | Disposition: A | Discharge status: Home / Self Care

## 2024-04-08 ENCOUNTER — Other Ambulatory Visit: Payer: Self-pay

## 2024-04-08 ENCOUNTER — Encounter: Payer: Self-pay | Admitting: Emergency Medicine

## 2024-04-08 ENCOUNTER — Emergency Department

## 2024-04-08 DIAGNOSIS — R112 Nausea with vomiting, unspecified: Secondary | ICD-10-CM

## 2024-04-08 DIAGNOSIS — D72829 Elevated white blood cell count, unspecified: Secondary | ICD-10-CM | POA: Diagnosis not present

## 2024-04-08 DIAGNOSIS — R1110 Vomiting, unspecified: Secondary | ICD-10-CM | POA: Diagnosis present

## 2024-04-08 DIAGNOSIS — E1065 Type 1 diabetes mellitus with hyperglycemia: Secondary | ICD-10-CM | POA: Insufficient documentation

## 2024-04-08 DIAGNOSIS — E876 Hypokalemia: Secondary | ICD-10-CM | POA: Insufficient documentation

## 2024-04-08 DIAGNOSIS — E878 Other disorders of electrolyte and fluid balance, not elsewhere classified: Secondary | ICD-10-CM | POA: Diagnosis not present

## 2024-04-08 DIAGNOSIS — R110 Nausea: Secondary | ICD-10-CM | POA: Diagnosis not present

## 2024-04-08 DIAGNOSIS — R1115 Cyclical vomiting syndrome unrelated to migraine: Secondary | ICD-10-CM | POA: Diagnosis not present

## 2024-04-08 DIAGNOSIS — E871 Hypo-osmolality and hyponatremia: Secondary | ICD-10-CM | POA: Diagnosis not present

## 2024-04-08 DIAGNOSIS — R1030 Lower abdominal pain, unspecified: Secondary | ICD-10-CM | POA: Insufficient documentation

## 2024-04-08 LAB — CBC
HCT: 44.8 % (ref 39.0–52.0)
Hemoglobin: 15.8 g/dL (ref 13.0–17.0)
MCH: 30.8 pg (ref 26.0–34.0)
MCHC: 35.3 g/dL (ref 30.0–36.0)
MCV: 87.3 fL (ref 80.0–100.0)
Platelets: 377 10*3/uL (ref 150–400)
RBC: 5.13 MIL/uL (ref 4.22–5.81)
RDW: 11.6 % (ref 11.5–15.5)
WBC: 18.7 10*3/uL — ABNORMAL HIGH (ref 4.0–10.5)
nRBC: 0 % (ref 0.0–0.2)

## 2024-04-08 LAB — BASIC METABOLIC PANEL WITH GFR
Anion gap: 19 — ABNORMAL HIGH (ref 5–15)
BUN: 28 mg/dL — ABNORMAL HIGH (ref 6–20)
CO2: 25 mmol/L (ref 22–32)
Calcium: 8.1 mg/dL — ABNORMAL LOW (ref 8.9–10.3)
Chloride: 89 mmol/L — ABNORMAL LOW (ref 98–111)
Creatinine, Ser: 0.84 mg/dL (ref 0.61–1.24)
GFR, Estimated: >60 mL/min (ref >60)
Glucose, Bld: 292 mg/dL — ABNORMAL HIGH (ref 70–99)
Potassium: 3.3 mmol/L — ABNORMAL LOW (ref 3.5–5.1)
Sodium: 133 mmol/L — ABNORMAL LOW (ref 135–145)

## 2024-04-08 LAB — LIPASE, BLOOD: Lipase: 23 U/L (ref 11–51)

## 2024-04-08 LAB — BLOOD GAS, VENOUS
Acid-Base Excess: 9.9 mmol/L — ABNORMAL HIGH (ref 0.0–2.0)
Bicarbonate: 34.3 mmol/L — ABNORMAL HIGH (ref 20.0–28.0)
O2 Saturation: 91.5 %
Patient temperature: 37
pCO2, Ven: 44 mmHg (ref 44–60)
pH, Ven: 7.5 — ABNORMAL HIGH (ref 7.25–7.43)
pO2, Ven: 60 mmHg — ABNORMAL HIGH (ref 32–45)

## 2024-04-08 LAB — COMPREHENSIVE METABOLIC PANEL WITH GFR
ALT: 37 U/L (ref 0–44)
AST: 60 U/L — ABNORMAL HIGH (ref 15–41)
Albumin: 4.6 g/dL (ref 3.5–5.0)
Alkaline Phosphatase: 61 U/L (ref 38–126)
Anion gap: 23 — ABNORMAL HIGH (ref 5–15)
BUN: 32 mg/dL — ABNORMAL HIGH (ref 6–20)
CO2: 27 mmol/L (ref 22–32)
Calcium: 9.4 mg/dL (ref 8.9–10.3)
Chloride: 84 mmol/L — ABNORMAL LOW (ref 98–111)
Creatinine, Ser: 0.98 mg/dL (ref 0.61–1.24)
GFR, Estimated: >60 mL/min (ref >60)
Glucose, Bld: 339 mg/dL — ABNORMAL HIGH (ref 70–99)
Potassium: 3.1 mmol/L — ABNORMAL LOW (ref 3.5–5.1)
Sodium: 134 mmol/L — ABNORMAL LOW (ref 135–145)
Total Bilirubin: 2.1 mg/dL — ABNORMAL HIGH (ref 0.0–1.2)
Total Protein: 7.8 g/dL (ref 6.5–8.1)

## 2024-04-08 LAB — CBG MONITORING, ED: Glucose-Capillary: 306 mg/dL — ABNORMAL HIGH (ref 70–99)

## 2024-04-08 LAB — BETA-HYDROXYBUTYRIC ACID: Beta-Hydroxybutyric Acid: 5.45 mmol/L — ABNORMAL HIGH (ref 0.05–0.27)

## 2024-04-08 MED ORDER — SODIUM CHLORIDE 0.9 % IV BOLUS
1000.0000 mL | Freq: Once | INTRAVENOUS | Status: AC
Start: 2024-04-08 — End: 2024-04-08
  Administered 2024-04-08: 1000 mL via INTRAVENOUS

## 2024-04-08 MED ORDER — POTASSIUM CHLORIDE 10 MEQ/100ML IV SOLN
10.0000 meq | Freq: Once | INTRAVENOUS | Status: AC
  Administered 2024-04-08: 10 meq via INTRAVENOUS
  Filled 2024-04-08: qty 100

## 2024-04-08 MED ORDER — MAGNESIUM SULFATE IN D5W 1-5 GM/100ML-% IV SOLN
1.0000 g | Freq: Once | INTRAVENOUS | Status: AC
  Administered 2024-04-08: 1 g via INTRAVENOUS
  Filled 2024-04-08: qty 100

## 2024-04-08 MED ORDER — PROCHLORPERAZINE EDISYLATE 10 MG/2ML IJ SOLN
10.0000 mg | Freq: Once | INTRAMUSCULAR | Status: AC
  Administered 2024-04-08: 10 mg via INTRAVENOUS
  Filled 2024-04-08: qty 2

## 2024-04-08 MED ORDER — METOCLOPRAMIDE HCL 5 MG/ML IJ SOLN
10.0000 mg | Freq: Once | INTRAMUSCULAR | Status: AC
  Administered 2024-04-08: 10 mg via INTRAVENOUS
  Filled 2024-04-08: qty 2

## 2024-04-08 MED ORDER — IOHEXOL 300 MG/ML  SOLN
100.0000 mL | Freq: Once | INTRAMUSCULAR | Status: AC | PRN
  Administered 2024-04-08: 100 mL via INTRAVENOUS

## 2024-04-08 MED ORDER — POTASSIUM CHLORIDE 20 MEQ PO PACK
40.0000 meq | PACK | Freq: Once | ORAL | Status: AC
  Administered 2024-04-08: 40 meq via ORAL
  Filled 2024-04-08: qty 2

## 2024-04-08 MED ORDER — SODIUM CHLORIDE 0.9 % IV BOLUS
1000.0000 mL | Freq: Once | INTRAVENOUS | Status: AC
  Administered 2024-04-08: 1000 mL via INTRAVENOUS

## 2024-04-08 MED ORDER — ONDANSETRON HCL 4 MG/2ML IJ SOLN
4.0000 mg | Freq: Once | INTRAMUSCULAR | Status: DC
  Filled 2024-04-08: qty 2

## 2024-04-08 MED ORDER — METOCLOPRAMIDE HCL 10 MG PO TABS: 10.0000 mg | ORAL_TABLET | Freq: Three times a day (TID) | ORAL | 0 refills | Status: DC

## 2024-04-08 NOTE — ED Notes (Signed)
 Pt verbalizes understanding of discharge instructions. Opportunity for questioning and answers were provided. Pt discharged from ED to home with mother.   ? ?

## 2024-04-08 NOTE — ED Provider Notes (Signed)
 [facility] Provider Note    Event Date/Time   First MD Initiated Contact with Patient 04/08/24 1151     (approximate)   History   Emesis  Patient to ED via POV for vomiting ongoing since Saturday. Seen at [facility] on Sunday for same. Having generalized abd pain. HX of diabetes- has not checked sugar since Sunday but takes insulin .   Cbg 306 in triage.   HPI Vincent Morgan is a 33 y.o. male PMH poorly controlled T1DM complicated by DKA, prolonged QTc presents for evaluation of vomiting - History offered by patient and mother bedside.  Has reportedly had intractable vomiting since Saturday, but 4-5 days now.  Does have some intermittent lower abdominal discomfort.   - No fever - Continues to make urine - Tells me he took his insulin  this morning  For chart review, appears patient does have frequent ED visits for abdominal pain, cyclic vomiting.  Per review of last hospitalization in 04/2023 for similar symptoms, suspected likely cannabis hyperemesis contributing to presentation.      Physical Exam   Triage Vital Signs: ED Triage Vitals  Encounter Vitals Group     BP 04/08/24 1125 (!) 129/95     Systolic BP Percentile --      Diastolic BP Percentile --      Pulse Rate 04/08/24 1125 (!) 123     Resp 04/08/24 1125 17     Temp 04/08/24 1125 98.4 F (36.9 C)     Temp Source 04/08/24 1125 Oral     SpO2 04/08/24 1125 97 %     Weight 04/08/24 1124 130 lb (59 kg)     Height 04/08/24 1124 6\' 2"  (1.88 m)     Head Circumference --      Peak Flow --      Pain Score 04/08/24 1124 10     Pain Loc --      Pain Education --      Exclude from Growth Chart --     Most recent vital signs: Vitals:   04/08/24 1400 04/08/24 1430  BP: 107/71 109/67  Pulse: 97 96  Resp: (!) 33 13  Temp:    SpO2: 99% 100%     General: Sleepy but arousable, no acute distress, appears dry CV:  Good peripheral perfusion.  Tachycardic, regular rhythm, RP 2+ Resp:  Normal  effort. CTAB Abd:  No distention.  Overall very reassuring abdominal exam, trace tenderness with no grimacing to deep palpation in lower abdomen only, no tenderness elsewhere    ED Results / Procedures / Treatments   Labs (all labs ordered are listed, but only abnormal results are displayed) Labs Reviewed  COMPREHENSIVE METABOLIC PANEL WITH GFR - Abnormal; Notable for the following components:      Result Value   Sodium 134 (*)    Potassium 3.1 (*)    Chloride 84 (*)    Glucose, Bld 339 (*)    BUN 32 (*)    AST 60 (*)    Total Bilirubin 2.1 (*)    Anion gap 23 (*)    All other components within normal limits  CBC - Abnormal; Notable for the following components:   WBC 18.7 (*)    All other components within normal limits  BETA-HYDROXYBUTYRIC ACID - Abnormal; Notable for the following components:   Beta-Hydroxybutyric Acid 5.45 (*)    All other components within normal limits  BLOOD GAS, VENOUS - Abnormal; Notable for the following components:  pH, Ven 7.5 (*)    pO2, Ven 60 (*)    Bicarbonate 34.3 (*)    Acid-Base Excess 9.9 (*)    All other components within normal limits  BASIC METABOLIC PANEL WITH GFR - Abnormal; Notable for the following components:   Sodium 133 (*)    Potassium 3.3 (*)    Chloride 89 (*)    Glucose, Bld 292 (*)    BUN 28 (*)    Calcium 8.1 (*)    Anion gap 19 (*)    All other components within normal limits  CBG MONITORING, ED - Abnormal; Notable for the following components:   Glucose-Capillary 306 (*)    All other components within normal limits  LIPASE, BLOOD  URINALYSIS, ROUTINE W REFLEX MICROSCOPIC     EKG  Ecg = sinus tachycardia, rate 100, no ST elevation depression, no significant repolarization normality, normal axis, normal intervals.  Events of ischemia nor arrhythmia on my read.  QTc prolonged at 502.   RADIOLOGY Chest x-ray and CT abdomen pelvis with no obvious pathology on my interpretation.  Formal radiology report is  pending.  PROCEDURES:  Critical Care performed: No  Procedures   MEDICATIONS ORDERED IN ED: Medications  sodium chloride  0.9 % bolus 1,000 mL (0 mLs Intravenous Stopped 04/08/24 1409)  prochlorperazine (COMPAZINE) injection 10 mg (10 mg Intravenous Given 04/08/24 1218)  potassium chloride  10 mEq in 100 mL IVPB (0 mEq Intravenous Stopped 04/08/24 1409)  potassium chloride  (KLOR-CON ) packet 40 mEq (40 mEq Oral Given 04/08/24 1320)  magnesium  sulfate IVPB 1 g 100 mL (0 g Intravenous Stopped 04/08/24 1410)  sodium chloride  0.9 % bolus 1,000 mL (0 mLs Intravenous Stopped 04/08/24 1410)  iohexol  (OMNIPAQUE ) 300 MG/ML solution 100 mL (100 mLs Intravenous Contrast Given 04/08/24 1303)  metoCLOPramide  (REGLAN ) injection 10 mg (10 mg Intravenous Given 04/08/24 1315)     IMPRESSION / MDM / ASSESSMENT AND PLAN / ED COURSE  I reviewed the triage vital signs and the nursing notes.                              DDX/MDM/AP: Differential diagnosis includes, but is not limited to, DKA, cannabis hyperemesis, diabetic gastroparesis, gastritis, doubt acute intra-abdominal pathology given very reassuring exam here though will reconsider if not improving.  Plan: - Labs - IV fluid - Antiemetics - EKG - Reassess  Patient's presentation is most consistent with acute presentation with potential threat to life or bodily function.  ED course below.  Workup with hyperglycemia to 339, anion gap 23 though bicarb 27-not fully consistent with DKA.  VBG reassuring, alkalotic, not acidotic, not consistent with DKA.  Beta hydroxybutyrate elevated, overall suspect starvation ketosis in the setting of tractable nausea vomiting over the past several days.  Did respond well to Reglan  here after initial Compazine.  Tolerating minimal p.o.  Repeat BMP after 2 L IV fluids, IV potassium improved --anion gap notably downtrending, potassium improving, glucose in the 290s.  CT abdomen pelvis with evidence of esophagitis though  otherwise unremarkable with no acute underlying pathology.  Presentation overall consistent with starvation ketosis in the setting of intractable nausea and vomiting of which patient has a long history.  Tolerating p.o. here, responded well to Reglan , will prescribe p.o.  Avoiding Zofran  in the setting of prolonged QTc.  Plan for outpatient primary care follow-up, would also likely benefit from outpatient GI eval -- will place referral.  Clinical Course as of  04/08/24 1523  Wed Apr 08, 2024  1230 CBC with leukocytosis, somewhat nonspecific in the setting of intractable nausea and vomiting, consider reactive  CMP with hypokalemia, hypochloremia, mild hyponatremia.  Glucose 339 with elevated anion gap though bicarb 27--suspect anion gap likely secondary to elevated BUN and lactate though consider some ketonuria in the setting of minimal p.o. intake over the past 4 days.  Highly doubt acidosis with a normal bicarb--not overall consistent with DKA at this time.  Will give further IV fluid.  Patient does have a newly elevated bilirubin to 2.1, given this in the setting of his leukocytosis and 4 days of abdominal pain, will escalate to CTAP. [MM]  1312 VBG with alkalosis, not consistent with DKA [MM]  1426 Repeat BMP notably improved, hypokalemia improved, glucose less than 300, BUN downtrending, anion gap down to 19 [MM]  1433 Patient reevaluated, will p.o. trial now with liquid potassium.  Awaiting results of CT [MM]  1515 CTAP: IMPRESSION: 1. No acute intra-abdominal or pelvic pathology. 2. Circumferential thickening of the visualized distal esophagus suggestive of esophagitis.   [MM]  1520 Patient reevaluated, discussed findings.  Feeling much better, tolerating p.o., amenable to discharge home.  Agrees Reglan  works very well for him, will prescribe Reglan  for home use.  Plan for PMD follow-up, also placed GI referral.  ED return precautions in place.  Patient agrees with plan. [MM]    Clinical  Course User Index [MM] Collis Deaner, MD     FINAL CLINICAL IMPRESSION(S) / ED DIAGNOSES   Final diagnoses:  Nausea and vomiting, unspecified vomiting type  Cyclical vomiting     Rx / DC Orders   ED Discharge Orders          Ordered    metoCLOPramide  (REGLAN ) 10 MG tablet  3 times daily with meals        04/08/24 1522    Ambulatory referral to Gastroenterology        04/08/24 1522             Note:  This document was prepared using Dragon voice recognition software and may include unintentional dictation errors.   Collis Deaner, MD 04/08/24 [phone]

## 2024-04-08 NOTE — Inpatient Diabetes Management (Addendum)
 Inpatient Diabetes Program Recommendations  AACE/ADA: New Consensus Statement on Inpatient Glycemic Control (2015)  Target Ranges:  Prepandial:   less than 140 mg/dL      Peak postprandial:   less than 180 mg/dL (1-2 hours)      Critically ill patients:  140 - 180 mg/dL   Lab Results  Component Value Date   GLUCAP 306 (H) 04/08/2024   HGBA1C >15.5 (H) 03/16/2023    Latest Reference Range & Units 04/08/24 11:27  Sodium 135 - 145 mmol/L 134 (L)  Potassium 3.5 - 5.1 mmol/L 3.1 (L)  Chloride 98 - 111 mmol/L 84 (L)  CO2 22 - 32 mmol/L 27  Glucose 70 - 99 mg/dL 409 (H)  BUN 6 - 20 mg/dL 32 (H)  Creatinine 8.11 - 1.24 mg/dL 9.14  Calcium 8.9 - 78.2 mg/dL 9.4  Anion gap 5 - 15  23 (H)  (L): Data is abnormally low (H): Data is abnormally high  Latest Reference Range & Units 04/08/24 11:27  Beta-Hydroxybutyric Acid 0.05 - 0.27 mmol/L 5.45 (H)  (H): Data is abnormally high  Diabetes history: DM1 Outpatient Diabetes medications: Novolog  70/30 10 units bid ac meals Current orders for Inpatient glycemic control: No orders yet  Inpatient Diabetes Program Recommendations:   Spoke with patient's mom Sheron Hightower regarding patient's insulin  taken since Sunday. Patient was in ED in [facility] Sunday on IV insulin , discharged home Sunday evening without taking subcutaneous insulin . Discussed IV insulin  only lasts 7-10 minutes. Patient had a glucose CGM but stopped wearing due to felt it was irritating. Patient has not checked CBG since Sunday. Recommend IV insulin  per [facility].  Thank you, Hiliana Eilts E. Emerita Berkemeier, RN, MSN, CDCES  Diabetes Coordinator Inpatient Glycemic Control Team Team Pager [phone] (8am-5pm) 04/08/2024 1:28 PM

## 2024-04-08 NOTE — ED Triage Notes (Signed)
 Patient to ED via POV for vomiting ongoing since Saturday. Seen at [facility] on Sunday for same. Having generalized abd pain. HX of diabetes- has not checked sugar since Sunday but takes insulin .   Cbg 306 in triage.

## 2024-04-08 NOTE — ED Notes (Signed)
 Pt gone to CT

## 2024-04-08 NOTE — Discharge Instructions (Addendum)
 Evaluation in the emergency department was notable for evidence of dehydration.  Your symptoms improved with treatment including IV fluid and antinausea medications as well as electrolyte repletion.  I have prescribed you a nausea medication to use at home.  I recommend you continue to avoid marijuana as this will likely exacerbate your symptoms.  I have placed a referral for you to follow-up with a gastroenterologist for reevaluation--they will contact you to schedule appointment.  I also recommend you follow-up with your primary care provider.  Return to the emergency department with any new or worsening symptoms.

## 2024-09-10 ENCOUNTER — Other Ambulatory Visit: Payer: Self-pay

## 2024-09-10 ENCOUNTER — Inpatient Hospital Stay
Admission: EM | Admit: 2024-09-10 | Discharge: 2024-09-13 | DRG: 638 | Disposition: A | Discharge status: Home / Self Care | Attending: Internal Medicine | Admitting: Internal Medicine

## 2024-09-10 DIAGNOSIS — D72829 Elevated white blood cell count, unspecified: Secondary | ICD-10-CM | POA: Diagnosis present

## 2024-09-10 DIAGNOSIS — R1116 Cannabis hyperemesis syndrome: Secondary | ICD-10-CM | POA: Diagnosis present

## 2024-09-10 DIAGNOSIS — Z91199 Patient's noncompliance with other medical treatment and regimen due to unspecified reason: Secondary | ICD-10-CM

## 2024-09-10 DIAGNOSIS — E1010 Type 1 diabetes mellitus with ketoacidosis without coma: Principal | ICD-10-CM | POA: Diagnosis present

## 2024-09-10 DIAGNOSIS — Z794 Long term (current) use of insulin: Secondary | ICD-10-CM

## 2024-09-10 DIAGNOSIS — E876 Hypokalemia: Secondary | ICD-10-CM | POA: Diagnosis present

## 2024-09-10 DIAGNOSIS — E1110 Type 2 diabetes mellitus with ketoacidosis without coma: Secondary | ICD-10-CM | POA: Diagnosis present

## 2024-09-10 DIAGNOSIS — R7989 Other specified abnormal findings of blood chemistry: Secondary | ICD-10-CM

## 2024-09-10 DIAGNOSIS — Z681 Body mass index (BMI) 19 or less, adult: Secondary | ICD-10-CM

## 2024-09-10 DIAGNOSIS — R112 Nausea with vomiting, unspecified: Secondary | ICD-10-CM

## 2024-09-10 DIAGNOSIS — E878 Other disorders of electrolyte and fluid balance, not elsewhere classified: Secondary | ICD-10-CM | POA: Diagnosis present

## 2024-09-10 DIAGNOSIS — Z7151 Drug abuse counseling and surveillance of drug abuser: Secondary | ICD-10-CM

## 2024-09-10 DIAGNOSIS — N179 Acute kidney failure, unspecified: Secondary | ICD-10-CM | POA: Diagnosis present

## 2024-09-10 DIAGNOSIS — E860 Dehydration: Secondary | ICD-10-CM | POA: Diagnosis present

## 2024-09-10 DIAGNOSIS — E871 Hypo-osmolality and hyponatremia: Secondary | ICD-10-CM | POA: Diagnosis present

## 2024-09-10 DIAGNOSIS — F17210 Nicotine dependence, cigarettes, uncomplicated: Secondary | ICD-10-CM | POA: Diagnosis present

## 2024-09-10 DIAGNOSIS — I493 Ventricular premature depolarization: Secondary | ICD-10-CM | POA: Diagnosis present

## 2024-09-10 DIAGNOSIS — R000 Tachycardia, unspecified: Secondary | ICD-10-CM | POA: Diagnosis present

## 2024-09-10 DIAGNOSIS — R64 Cachexia: Secondary | ICD-10-CM | POA: Diagnosis present

## 2024-09-10 DIAGNOSIS — F1210 Cannabis abuse, uncomplicated: Secondary | ICD-10-CM | POA: Diagnosis present

## 2024-09-10 MED ORDER — LACTATED RINGERS IV BOLUS (SEPSIS)
1000.0000 mL | Freq: Once | INTRAVENOUS | Status: AC
  Administered 2024-09-11: 1000 mL via INTRAVENOUS

## 2024-09-10 MED ORDER — METOCLOPRAMIDE HCL 5 MG/ML IJ SOLN
10.0000 mg | INTRAMUSCULAR | Status: AC
  Administered 2024-09-11: 10 mg via INTRAVENOUS
  Filled 2024-09-10: qty 2

## 2024-09-10 MED ORDER — LACTATED RINGERS IV BOLUS (SEPSIS): 2000.0000 mL | Freq: Once | INTRAVENOUS | Status: DC

## 2024-09-10 NOTE — ED Provider Notes (Incomplete)
 [facility] Provider Note    Event Date/Time   First MD Initiated Contact with Patient 09/10/24 2334     (approximate)   History   Emesis (As per EMS the patient has DM, but does not take his medication, 2 days of N&V. Ems gave 4 zofran , and 500 cc of NS)   HPI Vincent Morgan is a 33 y.o. 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  He presents by EMS for 3 days of vomiting and generalized abdominal pain.  Denies fever.  No chest pain or shortness of breath.  States he has not been able to tolerate anything by mouth and feels very ill consistent with prior episodes of DKA.     Physical Exam   Triage Vital Signs: ED Triage Vitals  Encounter Vitals Group     BP 09/10/24 2324 130/71     Girls Systolic BP Percentile --      Girls Diastolic BP Percentile --      Boys Systolic BP Percentile --      Boys Diastolic BP Percentile --      Pulse Rate 09/10/24 2324 (!) 125     Resp 09/10/24 2324 20     Temp 09/10/24 2324 97.8 F (36.6 C)     Temp Source 09/10/24 2324 Oral     SpO2 09/10/24 2324 98 %     Weight 09/10/24 2326 56.7 kg (125 lb)     Height 09/10/24 2326 1.88 m (6' 2)     Head Circumference --      Peak Flow --      Pain Score --      Pain Loc --      Pain Education --      Exclude from Growth Chart --     Most recent vital signs: Vitals:   09/10/24 2324  BP: 130/71  Pulse: (!) 125  Resp: 20  Temp: 97.8 F (36.6 C)  SpO2: 98%    General: Awake, alert, appears uncomfortable and ill.  Has a appearance of both chronic and acute illness. CV:  Good peripheral perfusion, tachycardia with regular rhythm, normal heart sounds. Resp:  Normal effort. Speaking easily and comfortably, no accessory muscle usage nor intercostal retractions.  Lungs are clear to  auscultation.  Notably the patient has no tachypnea. Abd:  Cachectic.  No guarding with the report of mild generalized tenderness palpation throughout the abdomen with no focal tenderness.   ED Results / Procedures / Treatments   Labs (all labs ordered are listed, but only abnormal results are displayed) Labs Reviewed  COMPREHENSIVE METABOLIC PANEL WITH GFR - Abnormal; Notable for the following components:      Result Value   Sodium 132 (*)    Potassium 3.1 (*)    Chloride 73 (*)    CO2 17 (*)    Glucose, Bld 834 (*)    BUN 47 (*)    Creatinine, Ser 2.01 (*)    Total Protein 8.5 (*)    AST 64 (*)    Alkaline Phosphatase 127 (*)    Total Bilirubin 3.4 (*)    GFR, Estimated 44 (*)    Anion gap 42 (*)    All other components within normal limits  CBC WITH DIFFERENTIAL/PLATELET - Abnormal; Notable for the following components:   WBC 29.7 (*)  Neutro Abs 25.9 (*)    Monocytes Absolute 1.2 (*)    Abs Immature Granulocytes 1.79 (*)    All other components within normal limits  LACTIC ACID, PLASMA - Abnormal; Notable for the following components:   Lactic Acid, Venous 3.5 (*)    All other components within normal limits  BLOOD GAS, VENOUS - Abnormal; Notable for the following components:   pCO2, Ven 36 (*)    Bicarbonate 19.9 (*)    Acid-base deficit 5.1 (*)    All other components within normal limits  URINALYSIS, W/ REFLEX TO CULTURE (INFECTION SUSPECTED) - Abnormal; Notable for the following components:   Color, Urine STRAW (*)    APPearance CLEAR (*)    Glucose, UA >=500 (*)    Hgb urine dipstick SMALL (*)    Ketones, ur 80 (*)    Bacteria, UA RARE (*)    All other components within normal limits  MAGNESIUM  - Abnormal; Notable for the following components:   Magnesium  3.0 (*)    All other components within normal limits  LACTIC ACID, PLASMA  BETA-HYDROXYBUTYRIC ACID     EKG  ED ECG REPORT I, Darleene Dome, the attending physician, personally viewed and  interpreted this ECG.  Date: 09/10/2024 EKG Time: 23: 28 Rate: 125 Rhythm: Sinus tachycardia QRS Axis: Borderline right axis deviation Intervals: normal ST/T Wave abnormalities: Non-specific ST segment / T-wave changes, but no clear evidence of acute ischemia. Narrative Interpretation: no definitive evidence of acute ischemia; does not meet STEMI criteria.    RADIOLOGY See ED course for details   PROCEDURES:  Critical Care performed: Yes, see critical care procedure note(s)  .Critical Care  Performed by: Dome Darleene, MD Authorized by: Dome Darleene, MD   Critical care provider statement:    Critical care time (minutes):  45   Critical care time was exclusive of:  Separately billable procedures and treating other patients   Critical care was necessary to treat or prevent imminent or life-threatening deterioration of the following conditions:  Metabolic crisis   Critical care was time spent personally by me on the following activities:  Development of treatment plan with patient or surrogate, evaluation of patient's response to treatment, examination of patient, obtaining history from patient or surrogate, ordering and performing treatments and interventions, ordering and review of laboratory studies, ordering and review of radiographic studies, pulse oximetry, re-evaluation of patient's condition and review of old charts .1-3 Lead EKG Interpretation  Performed by: Dome Darleene, MD Authorized by: Dome Darleene, MD     Interpretation: abnormal     ECG rate:  120   ECG rate assessment: tachycardic     Rhythm: sinus tachycardia     Ectopy: none     Conduction: normal       IMPRESSION / MDM / ASSESSMENT AND PLAN / ED COURSE  I reviewed the triage vital signs and the nursing notes.                              Differential diagnosis includes, but is not limited to, DKA, HHS, acute infectious process, renal failure, other electrolyte or metabolic abnormality  Patient's  presentation is most consistent with acute presentation with potential threat to life or bodily function.  Labs/studies ordered: VBG, CBC with differential, beta hydroxybutyric acid, lactic acid, magnesium , urinalysis, CBC with differential, CMP, chest x-ray, EKG  Interventions/Medications given:  Medications  insulin  regular, human (MYXREDLIN ) 100 units/ 100 mL infusion (has  no administration in time range)  lactated ringers  infusion (has no administration in time range)  dextrose  5 % in lactated ringers  infusion (has no administration in time range)  dextrose  50 % solution 0-50 mL (has no administration in time range)  potassium chloride  10 mEq in 100 mL IVPB (has no administration in time range)  potassium chloride  SA (KLOR-CON  M) CR tablet 40 mEq (has no administration in time range)  lactated ringers  bolus 1,000 mL (1,000 mLs Intravenous New Bag/Given 09/11/24 0010)  metoCLOPramide  (REGLAN ) injection 10 mg (10 mg Intravenous Given 09/11/24 0011)    (Note:  hospital course my include additional interventions and/or labs/studies not listed above.)   Patient is both acutely and chronically ill-appearing.  Labs pending but I strongly suspect DKA based on his current presentation.  Vitals notable only for tachycardia, he has no coo small respirations.  I have ordered a liter of LR to start resuscitation and he already received 500 mL of crystalloid in the field by EMS.  He is still vomiting and after I reviewed his medical record I see that Reglan  has worked well for him in the past ordered 10 mg of Reglan  IV.  He is afebrile which is somewhat reassuring with no focal tenderness to palpation of the abdomen.  The patient is on the cardiac monitor to evaluate for evidence of arrhythmia and/or significant heart rate changes.   Clinical Course as of 09/11/24 9692  Fri Sep 11, 2024  0100 Lactic Acid, Venous(!!): 3.5 I do not feel that the lactic of 3.5 represents sepsis but rather that it  represents part of his overall metabolic abnormalities including diabetic ketoacidosis, volume depletion, and his intractable vomiting. [CF]  0105 Substantial laboratory abnormalities with a leukocytosis of nearly 30, glucose of 834 with an anion gap of 42, potassium of 3.1, creatinine of 2, lactic acid of 3.5, magnesium  of 3. [CF]  0105 Proceeding with treatment for DKA, beta hydroxybutyric acid in process.  I scheduled the insulin  to start in about an hour and a half to give a chance for at least some potassium repletion by IV and I also ordered 40 mill equivalents by mouth if he can tolerate it to try to replete his potassium before the expected intracellular fluid shift when starting insulin . [CF]  0122 I am consulting the hospitalist team for admission.   [CF]  0138 I consulted by phone with the admitting hospitalist, and they will admit the patient - Dr. Lawence [CF]  [phone] Of note, although the patient has a substantial leukocytosis and tachycardia, I think this is all due to his metabolic derangements.  I have no specific source to give him empiric broad-spectrum antibiotics and I think that that would be inadvisable given the alternative reasons for his abnormalities.  I will defer to the hospitalist if he wishes to provide empiric antibiotics [CF]  0307 Beta-Hydroxybutyric Acid(!): >8.00 [CF]    Clinical Course User Index [CF] Gordan Huxley, MD     FINAL CLINICAL IMPRESSION(S) / ED DIAGNOSES   Final diagnoses:  Diabetic ketoacidosis without coma associated with type 1 diabetes mellitus (HCC)  Elevated lactic acid level  Leukocytosis, unspecified type  Intractable nausea and vomiting     Rx / DC Orders   ED Discharge Orders     None        Note:  This document was prepared using Dragon voice recognition software and may include unintentional dictation errors.   Gordan Huxley, MD 09/11/24 9860    Gordan,  Darleene, MD 09/11/24 NICHOLOS    Gordan Darleene, MD 09/11/24 [id number]

## 2024-09-10 NOTE — ED Triage Notes (Addendum)
 Vital signs appear stable at this time, patient has had N&V for the past two days. Non compliant DM doers not take his medications. Sugar BSG reading was high. EMS Vital signs  114/74 BP 120 HR 98 % O2, 20 RR.  EMS gave  4 mg of zofran  and 500 CC of NS bolus at 23:00

## 2024-09-11 ENCOUNTER — Emergency Department

## 2024-09-11 ENCOUNTER — Encounter: Payer: Self-pay | Admitting: Family Medicine

## 2024-09-11 DIAGNOSIS — E860 Dehydration: Secondary | ICD-10-CM | POA: Diagnosis present

## 2024-09-11 DIAGNOSIS — E876 Hypokalemia: Secondary | ICD-10-CM | POA: Diagnosis present

## 2024-09-11 DIAGNOSIS — R64 Cachexia: Secondary | ICD-10-CM | POA: Diagnosis present

## 2024-09-11 DIAGNOSIS — F17210 Nicotine dependence, cigarettes, uncomplicated: Secondary | ICD-10-CM | POA: Diagnosis present

## 2024-09-11 DIAGNOSIS — R000 Tachycardia, unspecified: Secondary | ICD-10-CM | POA: Diagnosis present

## 2024-09-11 DIAGNOSIS — R112 Nausea with vomiting, unspecified: Secondary | ICD-10-CM

## 2024-09-11 DIAGNOSIS — E871 Hypo-osmolality and hyponatremia: Secondary | ICD-10-CM | POA: Diagnosis present

## 2024-09-11 DIAGNOSIS — E878 Other disorders of electrolyte and fluid balance, not elsewhere classified: Secondary | ICD-10-CM | POA: Diagnosis present

## 2024-09-11 DIAGNOSIS — I493 Ventricular premature depolarization: Secondary | ICD-10-CM | POA: Diagnosis present

## 2024-09-11 DIAGNOSIS — Z794 Long term (current) use of insulin: Secondary | ICD-10-CM | POA: Diagnosis not present

## 2024-09-11 DIAGNOSIS — Z91199 Patient's noncompliance with other medical treatment and regimen due to unspecified reason: Secondary | ICD-10-CM | POA: Diagnosis not present

## 2024-09-11 DIAGNOSIS — E1010 Type 1 diabetes mellitus with ketoacidosis without coma: Secondary | ICD-10-CM

## 2024-09-11 DIAGNOSIS — Z7151 Drug abuse counseling and surveillance of drug abuser: Secondary | ICD-10-CM | POA: Diagnosis not present

## 2024-09-11 DIAGNOSIS — N179 Acute kidney failure, unspecified: Secondary | ICD-10-CM | POA: Diagnosis present

## 2024-09-11 DIAGNOSIS — D72829 Elevated white blood cell count, unspecified: Secondary | ICD-10-CM | POA: Diagnosis present

## 2024-09-11 DIAGNOSIS — F1210 Cannabis abuse, uncomplicated: Secondary | ICD-10-CM | POA: Diagnosis present

## 2024-09-11 DIAGNOSIS — E1310 Other specified diabetes mellitus with ketoacidosis without coma: Secondary | ICD-10-CM | POA: Diagnosis not present

## 2024-09-11 DIAGNOSIS — Z681 Body mass index (BMI) 19 or less, adult: Secondary | ICD-10-CM | POA: Diagnosis not present

## 2024-09-11 DIAGNOSIS — R1116 Cannabis hyperemesis syndrome: Secondary | ICD-10-CM | POA: Diagnosis present

## 2024-09-11 LAB — BASIC METABOLIC PANEL WITH GFR
Anion gap: 35 — ABNORMAL HIGH (ref 5–15)
Anion gap: 24 — ABNORMAL HIGH (ref 5–15)
Anion gap: 20 — ABNORMAL HIGH (ref 5–15)
Anion gap: 12 (ref 5–15)
Anion gap: 15 (ref 5–15)
BUN: 43 mg/dL — ABNORMAL HIGH (ref 6–20)
BUN: 36 mg/dL — ABNORMAL HIGH (ref 6–20)
BUN: 32 mg/dL — ABNORMAL HIGH (ref 6–20)
BUN: 29 mg/dL — ABNORMAL HIGH (ref 6–20)
BUN: 27 mg/dL — ABNORMAL HIGH (ref 6–20)
CO2: 18 mmol/L — ABNORMAL LOW (ref 22–32)
CO2: 24 mmol/L (ref 22–32)
CO2: 26 mmol/L (ref 22–32)
CO2: 29 mmol/L (ref 22–32)
CO2: 29 mmol/L (ref 22–32)
Calcium: 9.3 mg/dL (ref 8.9–10.3)
Calcium: 9.1 mg/dL (ref 8.9–10.3)
Calcium: 9.2 mg/dL (ref 8.9–10.3)
Calcium: 9.1 mg/dL (ref 8.9–10.3)
Calcium: 9.2 mg/dL (ref 8.9–10.3)
Chloride: 85 mmol/L — ABNORMAL LOW (ref 98–111)
Chloride: 93 mmol/L — ABNORMAL LOW (ref 98–111)
Chloride: 96 mmol/L — ABNORMAL LOW (ref 98–111)
Chloride: 98 mmol/L (ref 98–111)
Chloride: 98 mmol/L (ref 98–111)
Creatinine, Ser: 1.67 mg/dL — ABNORMAL HIGH (ref 0.61–1.24)
Creatinine, Ser: 1.06 mg/dL (ref 0.61–1.24)
Creatinine, Ser: 0.94 mg/dL (ref 0.61–1.24)
Creatinine, Ser: 0.76 mg/dL (ref 0.61–1.24)
Creatinine, Ser: 0.74 mg/dL (ref 0.61–1.24)
GFR, Estimated: 55 mL/min — ABNORMAL LOW (ref >60)
GFR, Estimated: >60 mL/min (ref >60)
GFR, Estimated: >60 mL/min (ref >60)
GFR, Estimated: >60 mL/min (ref >60)
GFR, Estimated: >60 mL/min (ref >60)
Glucose, Bld: 469 mg/dL — ABNORMAL HIGH (ref 70–99)
Glucose, Bld: 243 mg/dL — ABNORMAL HIGH (ref 70–99)
Glucose, Bld: 225 mg/dL — ABNORMAL HIGH (ref 70–99)
Glucose, Bld: 178 mg/dL — ABNORMAL HIGH (ref 70–99)
Glucose, Bld: 200 mg/dL — ABNORMAL HIGH (ref 70–99)
Potassium: 3 mmol/L — ABNORMAL LOW (ref 3.5–5.1)
Potassium: 4.1 mmol/L (ref 3.5–5.1)
Potassium: 3.6 mmol/L (ref 3.5–5.1)
Potassium: 3.7 mmol/L (ref 3.5–5.1)
Potassium: 3.8 mmol/L (ref 3.5–5.1)
Sodium: 138 mmol/L (ref 135–145)
Sodium: 141 mmol/L (ref 135–145)
Sodium: 142 mmol/L (ref 135–145)
Sodium: 139 mmol/L (ref 135–145)
Sodium: 142 mmol/L (ref 135–145)

## 2024-09-11 LAB — CBC WITH DIFFERENTIAL/PLATELET
Abs Immature Granulocytes: 1.79 K/uL — ABNORMAL HIGH (ref 0.00–0.07)
Basophils Absolute: 0.1 K/uL (ref 0.0–0.1)
Basophils Relative: 0 %
Eosinophils Absolute: 0 K/uL (ref 0.0–0.5)
Eosinophils Relative: 0 %
HCT: 45.4 % (ref 39.0–52.0)
Hemoglobin: 15.6 g/dL (ref 13.0–17.0)
Immature Granulocytes: 6 %
Lymphocytes Relative: 3 %
Lymphs Abs: 0.7 K/uL (ref 0.7–4.0)
MCH: 31.3 pg (ref 26.0–34.0)
MCHC: 34.4 g/dL (ref 30.0–36.0)
MCV: 91 fL (ref 80.0–100.0)
Monocytes Absolute: 1.2 K/uL — ABNORMAL HIGH (ref 0.1–1.0)
Monocytes Relative: 4 %
Neutro Abs: 25.9 K/uL — ABNORMAL HIGH (ref 1.7–7.7)
Neutrophils Relative %: 87 %
Platelets: 382 K/uL (ref 150–400)
RBC: 4.99 MIL/uL (ref 4.22–5.81)
RDW: 12.3 % (ref 11.5–15.5)
Smear Review: NORMAL
WBC: 29.7 K/uL — ABNORMAL HIGH (ref 4.0–10.5)
nRBC: 0 % (ref 0.0–0.2)

## 2024-09-11 LAB — BLOOD GAS, VENOUS
Acid-base deficit: 5.1 mmol/L — ABNORMAL HIGH (ref 0.0–2.0)
Bicarbonate: 19.9 mmol/L — ABNORMAL LOW (ref 20.0–28.0)
O2 Saturation: 64.4 %
Patient temperature: 37
pCO2, Ven: 36 mmHg — ABNORMAL LOW (ref 44–60)
pH, Ven: 7.35 (ref 7.25–7.43)
pO2, Ven: 37 mmHg (ref 32–45)

## 2024-09-11 LAB — COMPREHENSIVE METABOLIC PANEL WITH GFR
ALT: 34 U/L (ref 0–44)
AST: 64 U/L — ABNORMAL HIGH (ref 15–41)
Albumin: 4.1 g/dL (ref 3.5–5.0)
Alkaline Phosphatase: 127 U/L — ABNORMAL HIGH (ref 38–126)
Anion gap: 42 — ABNORMAL HIGH (ref 5–15)
BUN: 47 mg/dL — ABNORMAL HIGH (ref 6–20)
CO2: 17 mmol/L — ABNORMAL LOW (ref 22–32)
Calcium: 9.1 mg/dL (ref 8.9–10.3)
Chloride: 73 mmol/L — ABNORMAL LOW (ref 98–111)
Creatinine, Ser: 2.01 mg/dL — ABNORMAL HIGH (ref 0.61–1.24)
GFR, Estimated: 44 mL/min — ABNORMAL LOW (ref >60)
Glucose, Bld: 834 mg/dL (ref 70–99)
Potassium: 3.1 mmol/L — ABNORMAL LOW (ref 3.5–5.1)
Sodium: 132 mmol/L — ABNORMAL LOW (ref 135–145)
Total Bilirubin: 3.4 mg/dL — ABNORMAL HIGH (ref 0.0–1.2)
Total Protein: 8.5 g/dL — ABNORMAL HIGH (ref 6.5–8.1)

## 2024-09-11 LAB — URINALYSIS, W/ REFLEX TO CULTURE (INFECTION SUSPECTED)
Bilirubin Urine: NEGATIVE
Glucose, UA: >=500 mg/dL — AB
Ketones, ur: 80 mg/dL — AB
Leukocytes,Ua: NEGATIVE
Nitrite: NEGATIVE
Protein, ur: NEGATIVE mg/dL
RBC / HPF: 0 RBC/hpf (ref 0–5)
Specific Gravity, Urine: 1.021 (ref 1.005–1.030)
Squamous Epithelial / HPF: 0 /HPF (ref 0–5)
pH: 5 (ref 5.0–8.0)

## 2024-09-11 LAB — BETA-HYDROXYBUTYRIC ACID
Beta-Hydroxybutyric Acid: >8 mmol/L — ABNORMAL HIGH (ref 0.05–0.27)
Beta-Hydroxybutyric Acid: >8 mmol/L — ABNORMAL HIGH (ref 0.05–0.27)
Beta-Hydroxybutyric Acid: 5.88 mmol/L — ABNORMAL HIGH (ref 0.05–0.27)
Beta-Hydroxybutyric Acid: 3.82 mmol/L — ABNORMAL HIGH (ref 0.05–0.27)
Beta-Hydroxybutyric Acid: 1.43 mmol/L — ABNORMAL HIGH (ref 0.05–0.27)
Beta-Hydroxybutyric Acid: 1.6 mmol/L — ABNORMAL HIGH (ref 0.05–0.27)

## 2024-09-11 LAB — GLUCOSE, CAPILLARY
Glucose-Capillary: 196 mg/dL — ABNORMAL HIGH (ref 70–99)
Glucose-Capillary: 212 mg/dL — ABNORMAL HIGH (ref 70–99)
Glucose-Capillary: 217 mg/dL — ABNORMAL HIGH (ref 70–99)
Glucose-Capillary: 208 mg/dL — ABNORMAL HIGH (ref 70–99)
Glucose-Capillary: 196 mg/dL — ABNORMAL HIGH (ref 70–99)
Glucose-Capillary: 249 mg/dL — ABNORMAL HIGH (ref 70–99)
Glucose-Capillary: 181 mg/dL — ABNORMAL HIGH (ref 70–99)
Glucose-Capillary: 175 mg/dL — ABNORMAL HIGH (ref 70–99)
Glucose-Capillary: 212 mg/dL — ABNORMAL HIGH (ref 70–99)
Glucose-Capillary: 193 mg/dL — ABNORMAL HIGH (ref 70–99)
Glucose-Capillary: 181 mg/dL — ABNORMAL HIGH (ref 70–99)
Glucose-Capillary: 247 mg/dL — ABNORMAL HIGH (ref 70–99)

## 2024-09-11 LAB — CBG MONITORING, ED
Glucose-Capillary: 492 mg/dL — ABNORMAL HIGH (ref 70–99)
Glucose-Capillary: 335 mg/dL — ABNORMAL HIGH (ref 70–99)
Glucose-Capillary: 458 mg/dL — ABNORMAL HIGH (ref 70–99)
Glucose-Capillary: 323 mg/dL — ABNORMAL HIGH (ref 70–99)
Glucose-Capillary: 237 mg/dL — ABNORMAL HIGH (ref 70–99)
Glucose-Capillary: 237 mg/dL — ABNORMAL HIGH (ref 70–99)

## 2024-09-11 LAB — MRSA NEXT GEN BY PCR, NASAL: MRSA by PCR Next Gen: NOT DETECTED

## 2024-09-11 LAB — MAGNESIUM: Magnesium: 3 mg/dL — ABNORMAL HIGH (ref 1.7–2.4)

## 2024-09-11 LAB — LACTIC ACID, PLASMA
Lactic Acid, Venous: 2.7 mmol/L (ref 0.5–1.9)
Lactic Acid, Venous: 3.5 mmol/L (ref 0.5–1.9)

## 2024-09-11 MED ORDER — POTASSIUM CHLORIDE CRYS ER 20 MEQ PO TBCR: 40.0000 meq | EXTENDED_RELEASE_TABLET | Freq: Once | ORAL | Status: DC

## 2024-09-11 MED ORDER — INSULIN GLARGINE-YFGN 100 UNIT/ML ~~LOC~~ SOLN
15.0000 [IU] | Freq: Every day | SUBCUTANEOUS | Status: DC
  Administered 2024-09-11 – 2024-09-13 (×3): 15 [IU] via SUBCUTANEOUS
  Filled 2024-09-11 (×3): qty 0.15

## 2024-09-11 MED ORDER — DEXTROSE 50 % IV SOLN: 0.0000 mL | INTRAVENOUS | Status: DC | PRN

## 2024-09-11 MED ORDER — POTASSIUM CHLORIDE 10 MEQ/100ML IV SOLN
10.0000 meq | INTRAVENOUS | Status: AC
Start: 2024-09-11 — End: 2024-09-12
  Administered 2024-09-11 (×2): 10 meq via INTRAVENOUS
  Filled 2024-09-11 (×2): qty 100

## 2024-09-11 MED ORDER — POTASSIUM CHLORIDE 10 MEQ/100ML IV SOLN
10.0000 meq | INTRAVENOUS | Status: AC
  Administered 2024-09-11 (×2): 10 meq via INTRAVENOUS
  Filled 2024-09-11 (×2): qty 100

## 2024-09-11 MED ORDER — POTASSIUM CHLORIDE CRYS ER 20 MEQ PO TBCR
40.0000 meq | EXTENDED_RELEASE_TABLET | Freq: Once | ORAL | Status: AC
  Administered 2024-09-11: 40 meq via ORAL
  Filled 2024-09-11: qty 2

## 2024-09-11 MED ORDER — LACTATED RINGERS IV BOLUS
20.0000 mL/kg | Freq: Once | INTRAVENOUS | Status: AC
  Administered 2024-09-11: 1134 mL via INTRAVENOUS

## 2024-09-11 MED ORDER — INSULIN ASPART 100 UNIT/ML IJ SOLN
0.0000 [IU] | INTRAMUSCULAR | Status: DC
  Administered 2024-09-12: 4 [IU] via SUBCUTANEOUS
  Administered 2024-09-12 – 2024-09-13 (×4): 7 [IU] via SUBCUTANEOUS
  Administered 2024-09-13: 3 [IU] via SUBCUTANEOUS
  Administered 2024-09-13: 4 [IU] via SUBCUTANEOUS
  Filled 2024-09-11 (×7): qty 1

## 2024-09-11 MED ORDER — METOCLOPRAMIDE HCL 5 MG/ML IJ SOLN
10.0000 mg | Freq: Four times a day (QID) | INTRAMUSCULAR | Status: DC | PRN
  Administered 2024-09-11 – 2024-09-13 (×5): 10 mg via INTRAVENOUS
  Filled 2024-09-11 (×5): qty 2

## 2024-09-11 MED ORDER — POTASSIUM CHLORIDE 10 MEQ/100ML IV SOLN
10.0000 meq | INTRAVENOUS | Status: AC
  Administered 2024-09-11 (×4): 10 meq via INTRAVENOUS
  Filled 2024-09-11 (×4): qty 100

## 2024-09-11 MED ORDER — DEXTROSE IN LACTATED RINGERS 5 % IV SOLN: INTRAVENOUS | Status: AC

## 2024-09-11 MED ORDER — INSULIN ASPART 100 UNIT/ML IJ SOLN
0.0000 [IU] | INTRAMUSCULAR | Status: DC
  Administered 2024-09-11: 3 [IU] via SUBCUTANEOUS
  Administered 2024-09-11: 2 [IU] via SUBCUTANEOUS
  Filled 2024-09-11 (×2): qty 1

## 2024-09-11 MED ORDER — LACTATED RINGERS IV SOLN: INTRAVENOUS | Status: AC

## 2024-09-11 MED ORDER — CHLORHEXIDINE GLUCONATE CLOTH 2 % EX PADS
6.0000 | MEDICATED_PAD | Freq: Every day | CUTANEOUS | Status: DC
  Administered 2024-09-11 – 2024-09-13 (×3): 6 via TOPICAL

## 2024-09-11 MED ORDER — INSULIN REGULAR(HUMAN) IN NACL 100-0.9 UT/100ML-% IV SOLN
INTRAVENOUS | Status: AC
  Administered 2024-09-11: 6 [IU]/h via INTRAVENOUS
  Filled 2024-09-11: qty 100

## 2024-09-11 NOTE — Assessment & Plan Note (Signed)
-   The patient will be aggressively hydrated per Endo tool. - Antiemetics will be provided.

## 2024-09-11 NOTE — Assessment & Plan Note (Signed)
-   This is prerenal due to volume depletion and dehydration. - Will follow BMP with hydration

## 2024-09-11 NOTE — Progress Notes (Signed)
 PROGRESS NOTE   HPI was taken from Dr. Lawence: Vincent Morgan is a 33 y.o. male with medical history significant for diabetes mellitus, with noncompliance, as well as prolonged QT interval and cannabinoid hyperemesis syndrome, cyclical vomiting syndrome, who presented to the emergency room with acute onset of recurrent nausea and vomiting over the last couple of days.  No bilious vomitus or hematemesis.  He has been having generalized abdominal pain.  No diarrhea or melena or bright red bleeding per rectum.  No chest pain or palpitations.  No cough or wheezing or dyspnea.  No fever or chills.   ED Course: When the patient came to the ER, heart rate was 125 with otherwise normal vital signs.  Labs revealed VBG with pH 7.35 and HCO3 of 19.9.  CMP revealed hyponatremia 132 and hypochloremia of 73 with hypokalemia of 3.1, hyperglycemia of 834 with a CO2 of 17, BUN of 47 and creatinine 2.01 anion gap of 42 and magnesium  of 3 with alk phos of 127 AST of 64 and total protein of 8.5 with total bili of 3.4.  Lactic acid 3.5 and later 2.7 CBC showed leukocytosis 29.7 with neutrophilia beta-hydroxybutyrate was more than 8 UA showed more than 500 glucose, 80 ketones and rare bacteria. EKG as reviewed by me : EKG showed sinus tachycardia with rate 125 with PVCs with right atrial enlargement. Imaging: Portable chest x-ray showed no acute cardiopulmonary disease.   The patient was started on IV insulin  per Endo tool.  He will be admitted to a stepdown unit bed for further evaluation and management.   Vincent Morgan  FMW:[id number] DOB: 04-19-1991 DOA: 09/10/2024 PCP: [facility]    Assessment & Plan:   Principal Problem:   DKA (diabetic ketoacidosis) (HCC) Active Problems:   Intractable nausea and vomiting   AKI (acute kidney injury)   Hypokalemia  Assessment and Plan: DKA: anion gap is still elevated. Continue on insulin  drip. NPO.    DM1: poorly controlled, HbA1c >15.5 in 2024. Repeat  HbA1c ordered. NPO. Continue on insulin  drip. DM coordinator consulted. Non-complaint w/ insulin    Intractable nausea and vomiting: likely secondary to above. Zofran  prn   AKI: continue on IVFs   Hypokalemia: WNL currently.        DVT prophylaxis: SCDs Code Status: full  Family Communication: discussed pt's care w/ pt's mother, Sheron, and answered her questions  Disposition Plan:likely d/c back home  Level of care: Stepdown Status is: Inpatient Remains inpatient appropriate because: severity of illness    Consultants:    Procedures:   Antimicrobials:   Subjective: Pt c/o feeling thirsty   Objective: Vitals:   09/11/24 0400 09/11/24 0700 09/11/24 0730 09/11/24 0735  BP: (!) 126/91 (!) 131/110 123/84   Pulse: (!) 128 (!) 117 (!) 122   Resp: 16 14 (!) 21   Temp: 97.8 F (36.6 C)   98.2 F (36.8 C)  TempSrc:    Oral  SpO2: 96% 98% 100%   Weight:      Height:        Intake/Output Summary (Last 24 hours) at 09/11/2024 0943 Last data filed at 09/11/2024 0729 Gross per 24 hour  Intake 2410.06 ml  Output 1375 ml  Net 1035.06 ml   Filed Weights   09/10/24 2326  Weight: 56.7 kg    Examination:  General exam: Appears lethargic Respiratory system: Clear to auscultation. Respiratory effort normal. Cardiovascular system: S1 & S2 +. No rubs, gallops or clicks.  Gastrointestinal system: Abdomen  is nondistended, soft and nontender. Normal bowel sounds heard. Central nervous system: lethargic. Moves all extremities  Psychiatry: Judgement and insight appears not at baseline. Flat mood and afffect    Data Reviewed: I have personally reviewed following labs and imaging studies  CBC: Recent Labs  Lab 09/10/24 2336  WBC 29.7*  NEUTROABS 25.9*  HGB 15.6  HCT 45.4  MCV 91.0  PLT 382   Basic Metabolic Panel: Recent Labs  Lab 09/10/24 2336 09/11/24 0433  NA 132* 138  K 3.1* 3.0*  CL 73* 85*  CO2 17* 18*  GLUCOSE 834* 469*  BUN 47* 43*  CREATININE  2.01* 1.67*  CALCIUM 9.1 9.3  MG 3.0*  --    GFR: Estimated Creatinine Clearance: 50 mL/min (A) (by C-G formula based on SCr of 1.67 mg/dL (H)). Liver Function Tests: Recent Labs  Lab 09/10/24 2336  AST 64*  ALT 34  ALKPHOS 127*  BILITOT 3.4*  PROT 8.5*  ALBUMIN 4.1   No results for input(s): LIPASE, AMYLASE in the last 168 hours. No results for input(s): AMMONIA in the last 168 hours. Coagulation Profile: No results for input(s): INR, PROTIME in the last 168 hours. Cardiac Enzymes: No results for input(s): CKTOTAL, CKMB, CKMBINDEX, TROPONINI in the last 168 hours. BNP (last 3 results) No results for input(s): PROBNP in the last 8760 hours. HbA1C: No results for input(s): HGBA1C in the last 72 hours. CBG: Recent Labs  Lab 09/11/24 0535 09/11/24 0609 09/11/24 0718 09/11/24 0820 09/11/24 0923  GLUCAP 458* 335* 323* 237* 237*   Lipid Profile: No results for input(s): CHOL, HDL, LDLCALC, TRIG, CHOLHDL, LDLDIRECT in the last 72 hours. Thyroid Function Tests: No results for input(s): TSH, T4TOTAL, FREET4, T3FREE, THYROIDAB in the last 72 hours. Anemia Panel: No results for input(s): VITAMINB12, FOLATE, FERRITIN, TIBC, IRON, RETICCTPCT in the last 72 hours. Sepsis Labs: Recent Labs  Lab 09/10/24 2336 09/11/24 0433  LATICACIDVEN 3.5* 2.7*    No results found for this or any previous visit (from the past 240 hours).       Radiology Studies: DG Chest Portable 1 View Result Date: 09/11/2024 EXAM: 1 VIEW(S) XRAY OF THE CHEST 09/11/2024 01:49:13 AM COMPARISON: Chest x-ray 04/09/2011. CLINICAL HISTORY: vomiting, leukocytosis, evaluate for possible aspiration FINDINGS: LUNGS AND PLEURA: No focal pulmonary opacity. No pulmonary edema. No pleural effusion. No pneumothorax. HEART AND MEDIASTINUM: No acute abnormality of the cardiac and mediastinal silhouettes. BONES AND SOFT TISSUES: No acute osseous abnormality.  IMPRESSION: 1. No acute process. Electronically signed by: Greig Pique MD 09/11/2024 02:00 AM EDT RP Workstation: [id number]        Scheduled Meds:  potassium chloride   40 mEq Oral Once   Continuous Infusions:  dextrose  5% lactated ringers  Stopped (09/11/24 0425)   insulin  2 Units/hr (09/11/24 0825)   lactated ringers  125 mL/hr at 09/11/24 0205   potassium chloride  10 mEq (09/11/24 0741)     LOS: 0 days       Anthony CHRISTELLA Pouch, MD [facility] Pager [phone]  If 7PM-7AM, please contact night-coverage [email or web address] 09/11/2024, 9:43 AM

## 2024-09-11 NOTE — H&P (Signed)
 [place]   PATIENT NAME: Vincent Morgan    MR#:  [id number]  DATE OF BIRTH:  Sep 01, 1991  DATE OF ADMISSION:  09/10/2024  PRIMARY CARE PHYSICIAN: [facility]   Patient is coming from: Home  REQUESTING/REFERRING PHYSICIAN: Gordan Huxley, MD  CHIEF COMPLAINT:   Chief Complaint  Patient presents with   Emesis    As per EMS the patient has DM, but does not take his medication, 2 days of N&V. Ems gave 4 zofran , and 500 cc of NS    HISTORY OF PRESENT ILLNESS:  Vincent Morgan is a 33 y.o. male with medical history significant for diabetes mellitus, with noncompliance, as well as prolonged QT interval and cannabinoid hyperemesis syndrome, cyclical vomiting syndrome, who presented to the emergency room with acute onset of recurrent nausea and vomiting over the last couple of days.  No bilious vomitus or hematemesis.  He has been having generalized abdominal pain.  No diarrhea or melena or bright red bleeding per rectum.  No chest pain or palpitations.  No cough or wheezing or dyspnea.  No fever or chills.  ED Course: When the patient came to the ER, heart rate was 125 with otherwise normal vital signs.  Labs revealed VBG with pH 7.35 and HCO3 of 19.9.  CMP revealed hyponatremia 132 and hypochloremia of 73 with hypokalemia of 3.1, hyperglycemia of 834 with a CO2 of 17, BUN of 47 and creatinine 2.01 anion gap of 42 and magnesium  of 3 with alk phos of 127 AST of 64 and total protein of 8.5 with total bili of 3.4.  Lactic acid 3.5 and later 2.7 CBC showed leukocytosis 29.7 with neutrophilia beta-hydroxybutyrate was more than 8 UA showed more than 500 glucose, 80 ketones and rare bacteria. EKG as reviewed by me : EKG showed sinus tachycardia with rate 125 with PVCs with right atrial enlargement. Imaging: Portable chest x-ray showed no acute cardiopulmonary disease.  The patient was started on IV insulin  per Endo tool.  He will be admitted to a stepdown unit bed for further  evaluation and management. PAST MEDICAL HISTORY:   Past Medical History:  Diagnosis Date   Diabetes mellitus without complication (HCC)     PAST SURGICAL HISTORY:  No past surgical history on file.  SOCIAL HISTORY:   Social History   Tobacco Use   Smoking status: Every Day    Current packs/day: 0.25    Types: Cigarettes   Smokeless tobacco: Never  Substance Use Topics   Alcohol use: Never    FAMILY HISTORY:  No family history on file. No pertinent family diseases. DRUG ALLERGIES:  No Known Allergies  REVIEW OF SYSTEMS:   ROS As per history of present illness. All pertinent systems were reviewed above. Constitutional, HEENT, cardiovascular, respiratory, GI, GU, musculoskeletal, neuro, psychiatric, endocrine, integumentary and hematologic systems were reviewed and are otherwise negative/unremarkable except for positive findings mentioned above in the HPI.   MEDICATIONS AT HOME:   Prior to Admission medications   Medication Sig Start Date End Date Taking? Authorizing Provider  insulin  aspart protamine- aspart (NOVOLOG  MIX 70/30) (70-30) 100 UNIT/ML injection Inject 0.1 mLs (10 Units total) into the skin 2 (two) times daily with a meal. Patient not taking: Reported on 09/11/2024 03/17/23   Vincent Calvin NOVAK, MD      VITAL SIGNS:  Blood pressure (!) 126/91, pulse (!) 128, temperature 97.8 F (36.6 C), resp. rate 16, height 6' 2 (1.88 m), weight 56.7 kg, SpO2  96%.  PHYSICAL EXAMINATION:  Physical Exam  GENERAL: Acutely ill 33 y.o.-year-old patient lying in the bed with no acute distress. He was somnolent but arousable. EYES: Pupils equal, round, reactive to light and accommodation. No scleral icterus. Extraocular muscles intact.  HEENT: Head atraumatic, normocephalic. Oropharynx and nasopharynx clear.  NECK:  Supple, no jugular venous distention. No thyroid enlargement, no tenderness.  LUNGS: Normal breath sounds bilaterally, no wheezing, rales,rhonchi or  crepitation. No use of accessory muscles of respiration.  CARDIOVASCULAR: Regular rate and rhythm, S1, S2 normal. No murmurs, rubs, or gallops.  ABDOMEN: Soft, nondistended, nontender. Bowel sounds present. No organomegaly or mass.  EXTREMITIES: No pedal edema, cyanosis, or clubbing.  NEUROLOGIC: Cranial nerves II through XII are intact. Muscle strength 5/5 in all extremities. Sensation intact. Gait not checked.  PSYCHIATRIC: The patient is somnolent but arousable.  No good eye contact. SKIN: No obvious rash, lesion, or ulcer.   LABORATORY PANEL:   CBC Recent Labs  Lab 09/10/24 2336  WBC 29.7*  HGB 15.6  HCT 45.4  PLT 382   ------------------------------------------------------------------------------------------------------------------  Chemistries  Recent Labs  Lab 09/10/24 2336 09/11/24 0433  NA 132* 138  K 3.1* 3.0*  CL 73* 85*  CO2 17* 18*  GLUCOSE 834* 469*  BUN 47* 43*  CREATININE 2.01* 1.67*  CALCIUM 9.1 9.3  MG 3.0*  --   AST 64*  --   ALT 34  --   ALKPHOS 127*  --   BILITOT 3.4*  --    ------------------------------------------------------------------------------------------------------------------  Cardiac Enzymes No results for input(s): TROPONINI in the last 168 hours. ------------------------------------------------------------------------------------------------------------------  RADIOLOGY:  DG Chest Portable 1 View Result Date: 09/11/2024 EXAM: 1 VIEW(S) XRAY OF THE CHEST 09/11/2024 01:49:13 AM COMPARISON: Chest x-ray 04/09/2011. CLINICAL HISTORY: vomiting, leukocytosis, evaluate for possible aspiration FINDINGS: LUNGS AND PLEURA: No focal pulmonary opacity. No pulmonary edema. No pleural effusion. No pneumothorax. HEART AND MEDIASTINUM: No acute abnormality of the cardiac and mediastinal silhouettes. BONES AND SOFT TISSUES: No acute osseous abnormality. IMPRESSION: 1. No acute process. Electronically signed by: Greig Pique MD 09/11/2024 02:00 AM  EDT RP Workstation: [id number]      IMPRESSION AND PLAN:  Assessment and Plan: * DKA (diabetic ketoacidosis) (HCC) - The patient will be admitted to a stepdown bed. - We will continue the on IV insulin  drip per EndoTool DKA protocol. - The patient will be aggressively hydrated with IV normal saline. - Will follow serial BMPs.   Intractable nausea and vomiting - The patient will be aggressively hydrated per Endo tool. - Antiemetics will be provided.  AKI (acute kidney injury) - This is prerenal due to volume depletion and dehydration. - Will follow BMP with hydration  Hypokalemia - This is secondary to recurrent nausea and vomiting. - Replace potassium and follow levels.   DVT prophylaxis: Lovenox.  Advanced Care Planning:  Code Status: full code.  Family Communication:  The plan of care was discussed in details with the patient (and family). I answered all questions. The patient agreed to proceed with the above mentioned plan. Further management will depend upon hospital course. Disposition Plan: Back to previous home environment Consults called: none.  All the records are reviewed and case discussed with ED provider.  Status is: Inpatient  At the time of the admission, it appears that the appropriate admission status for this patient is inpatient.  This is judged to be reasonable and necessary in order to provide the required intensity of service to ensure the patient's safety given the  presenting symptoms, physical exam findings and initial radiographic and laboratory data in the context of comorbid conditions.  The patient requires inpatient status due to high intensity of service, high risk of further deterioration and high frequency of surveillance required.  I certify that at the time of admission, it is my clinical judgment that the patient will require inpatient hospital care extending more than 2 midnights.                            Dispo: The patient is from:  Home              Anticipated d/c is to: Home              Patient currently is not medically stable to d/c.              Difficult to place patient: No Authorized and performed by: Madison Peaches, MD Total critical care time:    40    minutes. Due to a high probability of clinically significant, life-threatening deterioration, the patient required my highest level of preparedness to intervene emergently and I personally spent this critical care time directly and personally managing the patient.  This critical care time included obtaining a history, examining the patient, pulse oximetry, ordering and review of studies, arranging urgent treatment with development of management plan, evaluation of patient's response to treatment, frequent reassessment, and discussions with other providers. This critical care time was performed to assess and manage the high probability of imminent, life-threatening deterioration that could result in multiorgan failure.  It was exclusive of separately billable procedures and treating other patients and teaching time.   Madison DELENA Peaches M.D on 09/11/2024 at 5:52 AM  [facility]   From 7 PM-7 AM, contact night-coverage [email or web address]  CC: Primary care physician; [facility], [employer]

## 2024-09-11 NOTE — Consult Note (Signed)
 PHARMACY CONSULT NOTE - ELECTROLYTES  Pharmacy Consult for Electrolyte Monitoring and Replacement   Recent Labs: Height: 6' 2 (188 cm) Weight: 56.7 kg (125 lb) IBW/kg (Calculated) : 82.2 Estimated Creatinine Clearance: 78.8 mL/min (by C-G formula based on SCr of 1.06 mg/dL). Potassium (mmol/L)  Date Value  09/11/2024 4.1  12/15/2014 4.4   Magnesium  (mg/dL)  Date Value  89/69/7974 3.0 (H)   Calcium (mg/dL)  Date Value  89/68/7974 9.1   Calcium, Total (mg/dL)  Date Value  97/96/7983 8.9   Albumin (g/dL)  Date Value  89/69/7974 4.1  12/15/2014 3.9   Sodium (mmol/L)  Date Value  09/11/2024 141  12/15/2014 136   Assessment  Vincent Morgan is a 33 y.o. male presenting with acute onset of recurrent nausea and vomiting. PMH significant for DM, prolonged QT interval, cannabinoid hyperemesis syndrome and cyclical vomiting. Pharmacy has been consulted to monitor and replace electrolytes.  Diet: NPO  MIVF:  LR @ 125 mL/hr until CBG < 250 mg/dL  Dextrose  5% in LR @ 125 mL/hr to start when CBG < 250 mg/dL  Pertinent medications: n/a  Goal of Therapy: Electrolytes WNL  Plan:  Received Kcl 10mEq IV x 4 this AM Received Kcl 40mEq PO x 1 this AM Check BMP every 4 hours while on insulin  infusion Check BMP, Mg, Phos with AM labs tomorrow  Thank you for allowing pharmacy to be a part of this patient's care.  Bernardino George, PharmD Candidate [phone] [employer] [facility] [employer] 09/11/2024 11:33 AM

## 2024-09-11 NOTE — Plan of Care (Signed)
  Problem: Coping: Goal: Ability to adjust to condition or change in health will improve Outcome: Progressing   Problem: Fluid Volume: Goal: Ability to maintain a balanced intake and output will improve Outcome: Progressing   Problem: Health Behavior/Discharge Planning: Goal: Ability to manage health-related needs will improve Outcome: Progressing   Problem: Metabolic: Goal: Ability to maintain appropriate glucose levels will improve Outcome: Progressing   Problem: Skin Integrity: Goal: Risk for impaired skin integrity will decrease Outcome: Progressing   Problem: Tissue Perfusion: Goal: Adequacy of tissue perfusion will improve Outcome: Progressing   Problem: Cardiac: Goal: Ability to maintain an adequate cardiac output will improve Outcome: Progressing   Problem: Health Behavior/Discharge Planning: Goal: Ability to identify and utilize available resources and services will improve Outcome: Progressing Goal: Ability to manage health-related needs will improve Outcome: Progressing   Problem: Clinical Measurements: Goal: Cardiovascular complication will be avoided Outcome: Progressing   Problem: Pain Managment: Goal: General experience of comfort will improve and/or be controlled Outcome: Progressing

## 2024-09-11 NOTE — Inpatient Diabetes Management (Signed)
 Inpatient Diabetes Program Recommendations  AACE/ADA: New Consensus Statement on Inpatient Glycemic Control (2015)  Target Ranges:  Prepandial:   less than 140 mg/dL      Peak postprandial:   less than 180 mg/dL (1-2 hours)      Critically ill patients:  140 - 180 mg/dL   Lab Results  Component Value Date   GLUCAP 217 (H) 09/11/2024   HGBA1C >15.5 (H) 03/16/2023    Review of Glycemic Control  Diabetes history: DM1 Outpatient Diabetes medications: Novolog  70/30 10 units bid ac meals Current orders for Inpatient glycemic control: IV insulin   Inpatient Diabetes Program Recommendations:   Spoke with patient regarding missing doses of insulin  and reviewed importance of checking CBGs and taking insulin . Patient acknowledged that he needs to take insulin .  Thank you, Edy Belt E. Florencia Zaccaro, RN, MSN, CNS, CDCES  Diabetes Coordinator Inpatient Glycemic Control Team Team Pager [phone] (8am-5pm) 09/11/2024 1:17 PM

## 2024-09-11 NOTE — ED Notes (Signed)
 Pt fall bundle in place

## 2024-09-11 NOTE — Assessment & Plan Note (Signed)
-

## 2024-09-11 NOTE — Assessment & Plan Note (Signed)
-   This is secondary to recurrent nausea and vomiting. - Replace potassium and follow levels.

## 2024-09-11 NOTE — Consult Note (Signed)
 PHARMACY CONSULT NOTE - ELECTROLYTES  Pharmacy Consult for Electrolyte Monitoring and Replacement   Recent Labs: Height: 6' 2 (188 cm) Weight: 56.7 kg (125 lb) IBW/kg (Calculated) : 82.2 Estimated Creatinine Clearance: 88.8 mL/min (by C-G formula based on SCr of 0.94 mg/dL). Potassium (mmol/L)  Date Value  09/11/2024 3.6  12/15/2014 4.4   Magnesium  (mg/dL)  Date Value  89/69/7974 3.0 (H)   Calcium (mg/dL)  Date Value  89/68/7974 9.2   Calcium, Total (mg/dL)  Date Value  97/96/7983 8.9   Albumin (g/dL)  Date Value  89/69/7974 4.1  12/15/2014 3.9   Sodium (mmol/L)  Date Value  09/11/2024 142  12/15/2014 136   Assessment  Vincent Morgan is a 33 y.o. male presenting with acute onset of recurrent nausea and vomiting. PMH significant for DM, prolonged QT interval, cannabinoid hyperemesis syndrome and cyclical vomiting. Pharmacy has been consulted to monitor and replace electrolytes.  Diet: NPO  MIVF:  LR @ 125 mL/hr until CBG < 250 mg/dL  Dextrose  5% in LR @ 125 mL/hr to start when CBG < 250 mg/dL  Pertinent medications: n/a  Goal of Therapy: Electrolytes WNL  Plan:  KCl 10mEq IV x 2 Check BMP every 4 hours while on insulin  infusion Check BMP, Mg, Phos with AM labs tomorrow  Thank you for allowing pharmacy to be a part of this patient's care.  Leonor Argyle, PharmD 09/11/2024 1:36 PM

## 2024-09-11 NOTE — Consult Note (Signed)
 PHARMACY CONSULT NOTE - ELECTROLYTES  Pharmacy Consult for Electrolyte Monitoring and Replacement   Recent Labs: Height: 6' 2 (188 cm) Weight: 56.7 kg (125 lb) IBW/kg (Calculated) : 82.2 Estimated Creatinine Clearance: 104.3 mL/min (by C-G formula based on SCr of 0.76 mg/dL). Potassium (mmol/L)  Date Value  09/11/2024 3.7  12/15/2014 4.4   Magnesium  (mg/dL)  Date Value  89/69/7974 3.0 (H)   Calcium (mg/dL)  Date Value  89/68/7974 9.1   Calcium, Total (mg/dL)  Date Value  97/96/7983 8.9   Albumin (g/dL)  Date Value  89/69/7974 4.1  12/15/2014 3.9   Sodium (mmol/L)  Date Value  09/11/2024 139  12/15/2014 136   Assessment  Vincent Morgan is a 33 y.o. male presenting with acute onset of recurrent nausea and vomiting. PMH significant for DM, prolonged QT interval, cannabinoid hyperemesis syndrome and cyclical vomiting. Pharmacy has been consulted to monitor and replace electrolytes.  Diet: NPO  MIVF:  LR @ 125 mL/hr until CBG < 250 mg/dL  Dextrose  5% in LR @ 125 mL/hr to start when CBG < 250 mg/dL  Pertinent medications: n/a  Goal of Therapy: Electrolytes WNL  Plan:  K = 3.7, give Kcl 10 mEq IV x 2 Check BMP every 4 hours while on insulin  infusion Check BMP, Mg, Phos with AM labs tomorrow  Thank you for allowing pharmacy to be a part of this patient's care.  Kayla Blew III, PharmD 09/11/2024 5:08 PM

## 2024-09-12 DIAGNOSIS — E1010 Type 1 diabetes mellitus with ketoacidosis without coma: Secondary | ICD-10-CM | POA: Diagnosis not present

## 2024-09-12 LAB — GLUCOSE, CAPILLARY
Glucose-Capillary: 236 mg/dL — ABNORMAL HIGH (ref 70–99)
Glucose-Capillary: 167 mg/dL — ABNORMAL HIGH (ref 70–99)
Glucose-Capillary: 221 mg/dL — ABNORMAL HIGH (ref 70–99)
Glucose-Capillary: 71 mg/dL (ref 70–99)
Glucose-Capillary: 209 mg/dL — ABNORMAL HIGH (ref 70–99)

## 2024-09-12 LAB — MAGNESIUM: Magnesium: 2.5 mg/dL — ABNORMAL HIGH (ref 1.7–2.4)

## 2024-09-12 LAB — PHOSPHORUS: Phosphorus: 1.8 mg/dL — ABNORMAL LOW (ref 2.5–4.6)

## 2024-09-12 MED ORDER — POTASSIUM PHOSPHATES 15 MMOLE/5ML IV SOLN
30.0000 mmol | Freq: Once | INTRAVENOUS | Status: AC
  Administered 2024-09-12: 30 mmol via INTRAVENOUS
  Filled 2024-09-12: qty 10

## 2024-09-12 NOTE — Progress Notes (Addendum)
 Progress Note    Vincent Morgan  FMW:[id number] DOB: [birth year]  DOA: 09/10/2024 PCP: [facility]      Brief Narrative:    Medical records reviewed and are as summarized below:  Vincent Morgan is a 33 y.o. male with medical history significant for type 1 diabetes mellitus on insulin , medical nonadherence, prolonged QT interval, cannabinoid hyperemesis syndrome, cyclical vomiting syndrome, who presented to the ED with abdominal pain, recurrent nausea and vomiting for 2 days duration.  Labs significant for glucose of 834, sodium 132, potassium 3.1, bicarb 17, BUN 47, creatinine 2.01, AST 64, WBC 29.7, lactic acid 3.5, beta-hydroxybutyrate acid greater than 8  He was admitted to the hospital for DKA, AKI and electrolyte abnormalities.     Assessment/Plan:   Principal Problem:   DKA (diabetic ketoacidosis) (HCC) Active Problems:   Intractable nausea and vomiting   AKI (acute kidney injury)   Hypokalemia    Body mass index is 16.05 kg/m.   Type I DM with DKA: DKA has resolved.  Continue insulin  glargine 15 units daily.  NovoLog  as needed for hyperglycemia.  Monitor glucose closely and adjust insulin  as needed. S/p treatment with IV insulin  drip and IV fluids. He has been seen by the diabetic educator for education and recommendations. Home regimen: NovoLog  70/30 10 units twice daily AC meals    Hypophosphatemia: Phosphorus 1.8.  Replete with IV potassium phosphate and monitor levels. Hypokalemia: Improved   Marijuana use disorder, cannabis hyperemesis syndrome with recurrent nausea and vomiting: Antiemetics as needed.  Nursing staff report that he has been taking hot showers for hours throughout the day.   AKI: Resolved   Medical nonadherence: The importance of medical adherence to reduce the risk of mortality from diabetic complications was reiterated.   Diet Order             Diet Carb Modified Fluid consistency: Thin; Room service  appropriate? Yes  Diet effective now                                  Consultants: None  Procedures: None    Medications:    Chlorhexidine  Gluconate Cloth  6 each Topical Daily   insulin  aspart  0-20 Units Subcutaneous Q4H   insulin  glargine-yfgn  15 Units Subcutaneous Daily   Continuous Infusions:   Anti-infectives (From admission, onward)    None              Family Communication/Anticipated D/C date and plan/Code Status   DVT prophylaxis: Place and maintain sequential compression device Start: 09/11/24 1136     Code Status: Prior  Family Communication: None Disposition Plan: Plan to discharge home   Status is: Inpatient Remains inpatient appropriate because: DKA with electrolyte abnormalities       Subjective:   Interval events noted.  He complains of nausea and spitting up frequently.  No abdominal pain.  No other complaints.  Objective:    Vitals:   09/12/24 0850 09/12/24 1000 09/12/24 1030 09/12/24 1639  BP: 99/84   117/82  Pulse: (!) 115 (!) 101 99 (!) 115  Resp: 16   16  Temp: 98 F (36.7 C)   98.2 F (36.8 C)  TempSrc:      SpO2: 98%   99%  Weight:      Height:       No data found.   Intake/Output Summary (Last 24 hours)  at 09/12/2024 1657 Last data filed at 09/12/2024 0000 Gross per 24 hour  Intake 1035.66 ml  Output 1000 ml  Net 35.66 ml   Filed Weights   09/10/24 2326  Weight: 56.7 kg    Exam:  GEN: NAD, ill-looking, cachectic SKIN: Warm and dry EYES: No pallor or icterus ENT: MMM CV: RRR PULM: CTA B ABD: soft, ND, NT, +BS CNS: AAO x 3, non focal EXT: No edema or tenderness        Data Reviewed:   I have personally reviewed following labs and imaging studies:  Labs: Labs show the following:   Basic Metabolic Panel: Recent Labs  Lab 09/10/24 2336 09/11/24 0433 09/11/24 0928 09/11/24 1146 09/11/24 1629 09/11/24 2029 09/12/24 0502  NA 132* 138 141 142 139 142  --   K  3.1* 3.0* 4.1 3.6 3.7 3.8  --   CL 73* 85* 93* 96* 98 98  --   CO2 17* 18* 24 26 29 29   --   GLUCOSE 834* 469* 243* 225* 178* 200*  --   BUN 47* 43* 36* 32* 29* 27*  --   CREATININE 2.01* 1.67* 1.06 0.94 0.76 0.74  --   CALCIUM 9.1 9.3 9.1 9.2 9.1 9.2  --   MG 3.0*  --   --   --   --   --  2.5*  PHOS  --   --   --   --   --   --  1.8*   GFR Estimated Creatinine Clearance: 104.3 mL/min (by C-G formula based on SCr of 0.74 mg/dL). Liver Function Tests: Recent Labs  Lab 09/10/24 2336  AST 64*  ALT 34  ALKPHOS 127*  BILITOT 3.4*  PROT 8.5*  ALBUMIN 4.1   No results for input(s): LIPASE, AMYLASE in the last 168 hours. No results for input(s): AMMONIA in the last 168 hours. Coagulation profile No results for input(s): INR, PROTIME in the last 168 hours.  CBC: Recent Labs  Lab 09/10/24 2336  WBC 29.7*  NEUTROABS 25.9*  HGB 15.6  HCT 45.4  MCV 91.0  PLT 382   Cardiac Enzymes: No results for input(s): CKTOTAL, CKMB, CKMBINDEX, TROPONINI in the last 168 hours. BNP (last 3 results) No results for input(s): PROBNP in the last 8760 hours. CBG: Recent Labs  Lab 09/11/24 2326 09/12/24 0326 09/12/24 0742 09/12/24 1208 09/12/24 1639  GLUCAP 247* 236* 167* 221* 71   D-Dimer: No results for input(s): DDIMER in the last 72 hours. Hgb A1c: No results for input(s): HGBA1C in the last 72 hours. Lipid Profile: No results for input(s): CHOL, HDL, LDLCALC, TRIG, CHOLHDL, LDLDIRECT in the last 72 hours. Thyroid function studies: No results for input(s): TSH, T4TOTAL, T3FREE, THYROIDAB in the last 72 hours.  Invalid input(s): FREET3 Anemia work up: No results for input(s): VITAMINB12, FOLATE, FERRITIN, TIBC, IRON, RETICCTPCT in the last 72 hours. Sepsis Labs: Recent Labs  Lab 09/10/24 2336 09/11/24 0433  WBC 29.7*  --   LATICACIDVEN 3.5* 2.7*    Microbiology Recent Results (from the past 240 hours)  MRSA Next  Gen by PCR, Nasal     Status: None   Collection Time: 09/11/24 10:26 AM   Specimen: Nasal Mucosa; Nasal Swab  Result Value Ref Range Status   MRSA by PCR Next Gen NOT DETECTED NOT DETECTED Final    Comment: (NOTE) The GeneXpert MRSA Assay (FDA approved for NASAL specimens only), is one component of a comprehensive MRSA colonization surveillance program. It is not  intended to diagnose MRSA infection nor to guide or monitor treatment for MRSA infections. Test performance is not FDA approved in patients less than 52 years old. Performed at [facility], [address] [place], [place] [address]     Procedures and diagnostic studies:  DG Chest Portable 1 View Result Date: 09/11/2024 EXAM: 1 VIEW(S) XRAY OF THE CHEST 09/11/2024 01:49:13 AM COMPARISON: Chest x-ray 04/09/2011. CLINICAL HISTORY: vomiting, leukocytosis, evaluate for possible aspiration FINDINGS: LUNGS AND PLEURA: No focal pulmonary opacity. No pulmonary edema. No pleural effusion. No pneumothorax. HEART AND MEDIASTINUM: No acute abnormality of the cardiac and mediastinal silhouettes. BONES AND SOFT TISSUES: No acute osseous abnormality. IMPRESSION: 1. No acute process. Electronically signed by: Greig Pique MD 09/11/2024 02:00 AM EDT RP Workstation: [id number]               LOS: 1 day   Kimi Kroft  [facility]   Pager on [email or web address]. If 7PM-7AM, please contact night-coverage at [email or web address]     09/12/2024, 4:57 PM

## 2024-09-12 NOTE — Plan of Care (Signed)

## 2024-09-12 NOTE — Consult Note (Signed)
 PHARMACY CONSULT NOTE - ELECTROLYTES  Pharmacy Consult for Electrolyte Monitoring and Replacement   Recent Labs: Height: 6' 2 (188 cm) Weight: 56.7 kg (125 lb) IBW/kg (Calculated) : 82.2 Estimated Creatinine Clearance: 104.3 mL/min (by C-G formula based on SCr of 0.74 mg/dL). Potassium (mmol/L)  Date Value  09/11/2024 3.8  12/15/2014 4.4   Magnesium  (mg/dL)  Date Value  88/98/7974 2.5 (H)   Calcium (mg/dL)  Date Value  89/68/7974 9.2   Calcium, Total (mg/dL)  Date Value  97/96/7983 8.9   Albumin (g/dL)  Date Value  89/69/7974 4.1  12/15/2014 3.9   Phosphorus (mg/dL)  Date Value  88/98/7974 1.8 (L)   Sodium (mmol/L)  Date Value  09/11/2024 142  12/15/2014 136   Assessment  Vincent Morgan is a 33 y.o. male presenting with acute onset of recurrent nausea and vomiting. PMH significant for DM, prolonged QT interval, cannabinoid hyperemesis syndrome and cyclical vomiting. Pharmacy has been consulted to monitor and replace electrolytes.  Diet: Carb modified Pertinent medications: Transitioned from insulin  gtt to [place] regimen  Goal of Therapy: Electrolytes WNL  Plan:  Phos 1.8, give potassium phosphate 30 mmol IV x 1 dose Will sign off on electrolyte consult given patient has transitioned off insulin  gtt and anticipate further overall stability in electrolytes  Thank you for allowing pharmacy to be a part of this patient's care.  Marolyn KATHEE Mare 09/12/2024 7:32 AM

## 2024-09-12 NOTE — Progress Notes (Signed)
 Patient keeps taking tele monitor off and sitting in the shower for hours throughout the day. This nurse educated patient on the importance of the monitor and to finish is IV fluids. He denies nausea and vomiting.   Patient agrees and in the bed with monitor back on and IV fluids started. Call bell in reach.

## 2024-09-13 DIAGNOSIS — E1010 Type 1 diabetes mellitus with ketoacidosis without coma: Secondary | ICD-10-CM | POA: Diagnosis not present

## 2024-09-13 LAB — RENAL FUNCTION PANEL
Albumin: 3.2 g/dL — ABNORMAL LOW (ref 3.5–5.0)
Anion gap: 15 (ref 5–15)
BUN: 18 mg/dL (ref 6–20)
CO2: 32 mmol/L (ref 22–32)
Calcium: 9.2 mg/dL (ref 8.9–10.3)
Chloride: 90 mmol/L — ABNORMAL LOW (ref 98–111)
Creatinine, Ser: 0.67 mg/dL (ref 0.61–1.24)
GFR, Estimated: >60 mL/min (ref >60)
Glucose, Bld: 250 mg/dL — ABNORMAL HIGH (ref 70–99)
Phosphorus: 2.3 mg/dL — ABNORMAL LOW (ref 2.5–4.6)
Potassium: 3.9 mmol/L (ref 3.5–5.1)
Sodium: 137 mmol/L (ref 135–145)

## 2024-09-13 LAB — MAGNESIUM: Magnesium: 2 mg/dL (ref 1.7–2.4)

## 2024-09-13 LAB — GLUCOSE, CAPILLARY
Glucose-Capillary: 192 mg/dL — ABNORMAL HIGH (ref 70–99)
Glucose-Capillary: 89 mg/dL (ref 70–99)
Glucose-Capillary: 136 mg/dL — ABNORMAL HIGH (ref 70–99)
Glucose-Capillary: 171 mg/dL — ABNORMAL HIGH (ref 70–99)

## 2024-09-13 MED ORDER — INSULIN ASPART PROT & ASPART (70-30 MIX) 100 UNIT/ML ~~LOC~~ SUSP: 10.0000 [IU] | Freq: Two times a day (BID) | SUBCUTANEOUS | 0 refills | Status: AC

## 2024-09-13 NOTE — Plan of Care (Signed)
 Problem: Education: Goal: Ability to describe self-care measures that may prevent or decrease complications (Diabetes Survival Skills Education) will improve Outcome: Adequate for Discharge Goal: Individualized Educational Video(s) Outcome: Adequate for Discharge   Problem: Coping: Goal: Ability to adjust to condition or change in health will improve Outcome: Adequate for Discharge   Problem: Fluid Volume: Goal: Ability to maintain a balanced intake and output will improve Outcome: Adequate for Discharge   Problem: Health Behavior/Discharge Planning: Goal: Ability to identify and utilize available resources and services will improve Outcome: Adequate for Discharge Goal: Ability to manage health-related needs will improve Outcome: Adequate for Discharge   Problem: Metabolic: Goal: Ability to maintain appropriate glucose levels will improve Outcome: Adequate for Discharge   Problem: Nutritional: Goal: Maintenance of adequate nutrition will improve Outcome: Adequate for Discharge Goal: Progress toward achieving an optimal weight will improve Outcome: Adequate for Discharge   Problem: Skin Integrity: Goal: Risk for impaired skin integrity will decrease Outcome: Adequate for Discharge   Problem: Tissue Perfusion: Goal: Adequacy of tissue perfusion will improve Outcome: Adequate for Discharge   Problem: Education: Goal: Ability to describe self-care measures that may prevent or decrease complications (Diabetes Survival Skills Education) will improve Outcome: Adequate for Discharge Goal: Individualized Educational Video(s) Outcome: Adequate for Discharge   Problem: Cardiac: Goal: Ability to maintain an adequate cardiac output will improve Outcome: Adequate for Discharge   Problem: Health Behavior/Discharge Planning: Goal: Ability to identify and utilize available resources and services will improve Outcome: Adequate for Discharge Goal: Ability to manage health-related  needs will improve Outcome: Adequate for Discharge   Problem: Fluid Volume: Goal: Ability to achieve a balanced intake and output will improve Outcome: Adequate for Discharge   Problem: Metabolic: Goal: Ability to maintain appropriate glucose levels will improve Outcome: Adequate for Discharge   Problem: Nutritional: Goal: Maintenance of adequate nutrition will improve Outcome: Adequate for Discharge Goal: Maintenance of adequate weight for body size and type will improve Outcome: Adequate for Discharge   Problem: Respiratory: Goal: Will regain and/or maintain adequate ventilation Outcome: Adequate for Discharge   Problem: Urinary Elimination: Goal: Ability to achieve and maintain adequate renal perfusion and functioning will improve Outcome: Adequate for Discharge   Problem: Education: Goal: Knowledge of General Education information will improve Description: Including pain rating scale, medication(s)/side effects and non-pharmacologic comfort measures Outcome: Adequate for Discharge   Problem: Health Behavior/Discharge Planning: Goal: Ability to manage health-related needs will improve Outcome: Adequate for Discharge   Problem: Clinical Measurements: Goal: Ability to maintain clinical measurements within normal limits will improve Outcome: Adequate for Discharge Goal: Will remain free from infection Outcome: Adequate for Discharge Goal: Diagnostic test results will improve Outcome: Adequate for Discharge Goal: Respiratory complications will improve Outcome: Adequate for Discharge Goal: Cardiovascular complication will be avoided Outcome: Adequate for Discharge   Problem: Activity: Goal: Risk for activity intolerance will decrease Outcome: Adequate for Discharge   Problem: Nutrition: Goal: Adequate nutrition will be maintained Outcome: Adequate for Discharge   Problem: Coping: Goal: Level of anxiety will decrease Outcome: Adequate for Discharge   Problem:  Elimination: Goal: Will not experience complications related to bowel motility Outcome: Adequate for Discharge Goal: Will not experience complications related to urinary retention Outcome: Adequate for Discharge   Problem: Pain Managment: Goal: General experience of comfort will improve and/or be controlled Outcome: Adequate for Discharge   Problem: Safety: Goal: Ability to remain free from injury will improve Outcome: Adequate for Discharge   Problem: Skin Integrity: Goal: Risk for impaired skin  integrity will decrease Outcome: Adequate for Discharge

## 2024-09-13 NOTE — Progress Notes (Signed)
 This nurse spoke to patients mother. Was informed that her brother passed away recently and the viewing was today from 12-5pm 30 min away. She was unable to get him before.  Notified attending that the mom wanted a update from them before he leaves today.

## 2024-09-13 NOTE — Discharge Summary (Addendum)
 Physician Discharge Summary   Patient: Vincent Morgan MRN: [id number] DOB: 1991-03-23  Admit date:     09/10/2024  Discharge date: 09/13/24  Discharge Physician: AIDA CHO   PCP: [facility]   Recommendations at discharge:   Follow-up with PCP in 1 week  Discharge Diagnoses: Principal Problem:   DKA (diabetic ketoacidosis) (HCC) Active Problems:   Intractable nausea and vomiting   AKI (acute kidney injury)   Hypokalemia  Resolved Problems:   * No resolved hospital problems. *  Hospital Course:  Vincent Morgan is a 33 y.o. male with medical history significant for type 1 diabetes mellitus on insulin , medical nonadherence, prolonged QT interval, cannabinoid hyperemesis syndrome, cyclical vomiting syndrome, who presented to the ED with abdominal pain, recurrent nausea and vomiting for 2 days duration.   Labs significant for glucose of 834, sodium 132, potassium 3.1, bicarb 17, BUN 47, creatinine 2.01, AST 64, WBC 29.7, lactic acid 3.5, beta-hydroxybutyrate acid greater than 8   He was admitted to the hospital for DKA, AKI and electrolyte abnormalities.     Assessment and Plan:   Type I DM with DKA: DKA has resolved. Patient said he will continue with NovoLog  70/30 10 units twice daily at home. He said he still has some at home.  However, I provided him with a new prescription just in case he needs more insulin  Hemoglobin A1c still pending at the time of discharge. Last hemoglobin A1c in May 2024 was greater than 15.5. He has been seen by the diabetic educator for education and recommendations. Home regimen: NovoLog  70/30 10 units twice daily AC meals Close follow-up with PCP and endocrinologist strongly recommended.    Hypophosphatemia: Improved. Hypokalemia: Improved     Marijuana use disorder, cannabis hyperemesis syndrome with recurrent nausea and vomiting: Antiemetics as needed.   Counseled to quit using marijuana.     AKI: Resolved      Medical nonadherence: The importance of medical adherence to reduce the risk of mortality from diabetic complications was reiterated.   His condition has improved and he is deemed stable for discharge to home today.    I called his mother's phone at [phone] but there was no response.       Consultants: None Procedures performed: None  Disposition: Home Diet recommendation:  Discharge Diet Orders (From admission, onward)     Start     Ordered   09/13/24 0000  Diet - low sodium heart healthy        09/13/24 0958   09/13/24 0000  Diet Carb Modified        09/13/24 0958           Carb modified diet DISCHARGE MEDICATION: Allergies as of 09/13/2024   No Known Allergies      Medication List     TAKE these medications    insulin  aspart protamine- aspart (70-30) 100 UNIT/ML injection Commonly known as: NOVOLOG  MIX 70/30 Inject 0.1 mLs (10 Units total) into the skin 2 (two) times daily with a meal.        Discharge Exam: Filed Weights   09/10/24 2326  Weight: 56.7 kg   GEN: NAD SKIN: Warm and dry EYES: No pallor or icterus ENT: MMM CV: RRR PULM: CTA B ABD: soft, ND, NT, +BS CNS: AAO x 3, non focal EXT: No edema or tenderness   Condition at discharge: good  The results of significant diagnostics from this hospitalization (including imaging, microbiology, ancillary and laboratory) are listed below for  reference.   Imaging Studies: DG Chest Portable 1 View Result Date: 09/11/2024 EXAM: 1 VIEW(S) XRAY OF THE CHEST 09/11/2024 01:49:13 AM COMPARISON: Chest x-ray 04/09/2011. CLINICAL HISTORY: vomiting, leukocytosis, evaluate for possible aspiration FINDINGS: LUNGS AND PLEURA: No focal pulmonary opacity. No pulmonary edema. No pleural effusion. No pneumothorax. HEART AND MEDIASTINUM: No acute abnormality of the cardiac and mediastinal silhouettes. BONES AND SOFT TISSUES: No acute osseous abnormality. IMPRESSION: 1. No acute process. Electronically signed  by: Greig Pique MD 09/11/2024 02:00 AM EDT RP Workstation: [id number]    Microbiology: Results for orders placed or performed during the hospital encounter of 09/10/24  MRSA Next Gen by PCR, Nasal     Status: None   Collection Time: 09/11/24 10:26 AM   Specimen: Nasal Mucosa; Nasal Swab  Result Value Ref Range Status   MRSA by PCR Next Gen NOT DETECTED NOT DETECTED Final    Comment: (NOTE) The GeneXpert MRSA Assay (FDA approved for NASAL specimens only), is one component of a comprehensive MRSA colonization surveillance program. It is not intended to diagnose MRSA infection nor to guide or monitor treatment for MRSA infections. Test performance is not FDA approved in patients less than 24 years old. Performed at [facility], [address] [place], [place] [address]     Labs: CBC: Recent Labs  Lab 09/10/24 2336  WBC 29.7*  NEUTROABS 25.9*  HGB 15.6  HCT 45.4  MCV 91.0  PLT 382   Basic Metabolic Panel: Recent Labs  Lab 09/10/24 2336 09/11/24 0433 09/11/24 0928 09/11/24 1146 09/11/24 1629 09/11/24 2029 09/12/24 0502 09/13/24 0924  NA 132*   < > 141 142 139 142  --  137  K 3.1*   < > 4.1 3.6 3.7 3.8  --  3.9  CL 73*   < > 93* 96* 98 98  --  90*  CO2 17*   < > 24 26 29 29   --  32  GLUCOSE 834*   < > 243* 225* 178* 200*  --  250*  BUN 47*   < > 36* 32* 29* 27*  --  18  CREATININE 2.01*   < > 1.06 0.94 0.76 0.74  --  0.67  CALCIUM 9.1   < > 9.1 9.2 9.1 9.2  --  9.2  MG 3.0*  --   --   --   --   --  2.5* 2.0  PHOS  --   --   --   --   --   --  1.8* 2.3*   < > = values in this interval not displayed.   Liver Function Tests: Recent Labs  Lab 09/10/24 2336 09/13/24 0924  AST 64*  --   ALT 34  --   ALKPHOS 127*  --   BILITOT 3.4*  --   PROT 8.5*  --   ALBUMIN 4.1 3.2*   CBG: Recent Labs  Lab 09/12/24 0742 09/12/24 1208 09/12/24 1639 09/12/24 2105 09/13/24 0346  GLUCAP 167* 221* 71 209* 89    Discharge time spent: greater than 30  minutes.  Signed: AIDA CHO, MD [facility] 09/13/2024

## 2024-09-14 LAB — HEMOGLOBIN A1C
Hgb A1c MFr Bld: >15.5 % — ABNORMAL HIGH (ref 4.8–5.6)
Hgb A1c MFr Bld: >15.5 % — ABNORMAL HIGH (ref 4.8–5.6)
Mean Plasma Glucose: >398 mg/dL
Mean Plasma Glucose: >398 mg/dL

## 2024-09-15 ENCOUNTER — Inpatient Hospital Stay
Admission: EM | Admit: 2024-09-15 | Discharge: 2024-09-17 | DRG: 639 | Disposition: A | Discharge status: Home / Self Care | Attending: Internal Medicine | Admitting: Internal Medicine

## 2024-09-15 ENCOUNTER — Emergency Department

## 2024-09-15 ENCOUNTER — Encounter: Payer: Self-pay | Admitting: *Deleted

## 2024-09-15 ENCOUNTER — Other Ambulatory Visit: Payer: Self-pay

## 2024-09-15 DIAGNOSIS — E1010 Type 1 diabetes mellitus with ketoacidosis without coma: Principal | ICD-10-CM | POA: Diagnosis present

## 2024-09-15 DIAGNOSIS — E1165 Type 2 diabetes mellitus with hyperglycemia: Principal | ICD-10-CM | POA: Diagnosis present

## 2024-09-15 DIAGNOSIS — Z833 Family history of diabetes mellitus: Secondary | ICD-10-CM

## 2024-09-15 DIAGNOSIS — F32A Depression, unspecified: Secondary | ICD-10-CM | POA: Diagnosis present

## 2024-09-15 DIAGNOSIS — K047 Periapical abscess without sinus: Secondary | ICD-10-CM | POA: Diagnosis present

## 2024-09-15 DIAGNOSIS — F17210 Nicotine dependence, cigarettes, uncomplicated: Secondary | ICD-10-CM | POA: Diagnosis present

## 2024-09-15 DIAGNOSIS — R112 Nausea with vomiting, unspecified: Secondary | ICD-10-CM | POA: Diagnosis present

## 2024-09-15 DIAGNOSIS — Z794 Long term (current) use of insulin: Secondary | ICD-10-CM

## 2024-09-15 DIAGNOSIS — R9431 Abnormal electrocardiogram [ECG] [EKG]: Secondary | ICD-10-CM | POA: Diagnosis present

## 2024-09-15 DIAGNOSIS — Z91199 Patient's noncompliance with other medical treatment and regimen due to unspecified reason: Secondary | ICD-10-CM

## 2024-09-15 LAB — COMPREHENSIVE METABOLIC PANEL WITH GFR
ALT: 25 U/L (ref 0–44)
AST: 23 U/L (ref 15–41)
Albumin: 3.5 g/dL (ref 3.5–5.0)
Alkaline Phosphatase: 88 U/L (ref 38–126)
Anion gap: 19 — ABNORMAL HIGH (ref 5–15)
BUN: 18 mg/dL (ref 6–20)
CO2: 28 mmol/L (ref 22–32)
Calcium: 9.4 mg/dL (ref 8.9–10.3)
Chloride: 86 mmol/L — ABNORMAL LOW (ref 98–111)
Creatinine, Ser: 0.84 mg/dL (ref 0.61–1.24)
GFR, Estimated: >60 mL/min (ref >60)
Glucose, Bld: 222 mg/dL — ABNORMAL HIGH (ref 70–99)
Potassium: 3.5 mmol/L (ref 3.5–5.1)
Sodium: 133 mmol/L — ABNORMAL LOW (ref 135–145)
Total Bilirubin: 1.6 mg/dL — ABNORMAL HIGH (ref 0.0–1.2)
Total Protein: 7.7 g/dL (ref 6.5–8.1)

## 2024-09-15 LAB — CBC
HCT: 48 % (ref 39.0–52.0)
Hemoglobin: 16.3 g/dL (ref 13.0–17.0)
MCH: 30.9 pg (ref 26.0–34.0)
MCHC: 34 g/dL (ref 30.0–36.0)
MCV: 91.1 fL (ref 80.0–100.0)
Platelets: 406 K/uL — ABNORMAL HIGH (ref 150–400)
RBC: 5.27 MIL/uL (ref 4.22–5.81)
RDW: 11.9 % (ref 11.5–15.5)
WBC: 11.5 K/uL — ABNORMAL HIGH (ref 4.0–10.5)
nRBC: 0 % (ref 0.0–0.2)

## 2024-09-15 LAB — BLOOD GAS, VENOUS
Acid-Base Excess: 8.1 mmol/L — ABNORMAL HIGH (ref 0.0–2.0)
Bicarbonate: 33.4 mmol/L — ABNORMAL HIGH (ref 20.0–28.0)
O2 Saturation: 61.8 %
Patient temperature: 37
pCO2, Ven: 48 mmHg (ref 44–60)
pH, Ven: 7.45 — ABNORMAL HIGH (ref 7.25–7.43)
pO2, Ven: 37 mmHg (ref 32–45)

## 2024-09-15 LAB — BETA-HYDROXYBUTYRIC ACID: Beta-Hydroxybutyric Acid: 5.29 mmol/L — ABNORMAL HIGH (ref 0.05–0.27)

## 2024-09-15 LAB — CBG MONITORING, ED: Glucose-Capillary: 217 mg/dL — ABNORMAL HIGH (ref 70–99)

## 2024-09-15 LAB — LIPASE, BLOOD: Lipase: 20 U/L (ref 11–51)

## 2024-09-15 MED ORDER — METOCLOPRAMIDE HCL 5 MG/ML IJ SOLN
10.0000 mg | Freq: Once | INTRAMUSCULAR | Status: AC
  Administered 2024-09-15: 10 mg via INTRAVENOUS
  Filled 2024-09-15: qty 2

## 2024-09-15 MED ORDER — SODIUM CHLORIDE 0.9 % IV BOLUS
1000.0000 mL | Freq: Once | INTRAVENOUS | Status: AC
  Administered 2024-09-15: 1000 mL via INTRAVENOUS

## 2024-09-15 NOTE — ED Provider Notes (Signed)
 [facility] Provider Note    Event Date/Time   First MD Initiated Contact with Patient 09/15/24 2003     (approximate)   History   Abdominal Pain   HPI  Vincent Morgan is a 33 y.o. male who presents to the emergency department today because of concerns for abdominal pain, nausea and vomiting.  Patient was discharged from the hospital 2 days ago after an admission for DKA.  He says since leaving the hospital he has been having profound abdominal pain and nausea and vomiting.  He has not been able to tolerate significant p.o.  Says that he has been using his insulin  and his blood sugars have been running in the low 200s. Additionally has been having increased cough productive of green phlegm.      Physical Exam   Triage Vital Signs: ED Triage Vitals  Encounter Vitals Group     BP 09/15/24 1748 105/82     Girls Systolic BP Percentile --      Girls Diastolic BP Percentile --      Boys Systolic BP Percentile --      Boys Diastolic BP Percentile --      Pulse Rate 09/15/24 1748 (!) 120     Resp 09/15/24 1748 18     Temp 09/15/24 1748 98.9 F (37.2 C)     Temp Source 09/15/24 1748 Oral     SpO2 09/15/24 1748 99 %     Weight 09/15/24 1749 130 lb (59 kg)     Height 09/15/24 1749 6' 2 (1.88 m)     Head Circumference --      Peak Flow --      Pain Score 09/15/24 1749 10     Pain Loc --      Pain Education --      Exclude from Growth Chart --     Most recent vital signs: Vitals:   09/15/24 1748  BP: 105/82  Pulse: (!) 120  Resp: 18  Temp: 98.9 F (37.2 C)  SpO2: 99%   General: Awake, alert, oriented. CV:  Good peripheral perfusion. Tachycardic. Resp:  Normal effort. Lungs clear. Abd:  No distention. Soft. Minimal diffuse tenderness to palpation.   ED Results / Procedures / Treatments   Labs (all labs ordered are listed, but only abnormal results are displayed) Labs Reviewed  COMPREHENSIVE METABOLIC PANEL WITH GFR - Abnormal; Notable for  the following components:      Result Value   Sodium 133 (*)    Chloride 86 (*)    Glucose, Bld 222 (*)    Total Bilirubin 1.6 (*)    Anion gap 19 (*)    All other components within normal limits  CBC - Abnormal; Notable for the following components:   WBC 11.5 (*)    Platelets 406 (*)    All other components within normal limits  BLOOD GAS, VENOUS - Abnormal; Notable for the following components:   pH, Ven 7.45 (*)    Bicarbonate 33.4 (*)    Acid-Base Excess 8.1 (*)    All other components within normal limits  BETA-HYDROXYBUTYRIC ACID - Abnormal; Notable for the following components:   Beta-Hydroxybutyric Acid 5.29 (*)    All other components within normal limits  CBG MONITORING, ED - Abnormal; Notable for the following components:   Glucose-Capillary 217 (*)    All other components within normal limits  LIPASE, BLOOD  URINALYSIS, ROUTINE W REFLEX MICROSCOPIC     EKG  None   RADIOLOGY I independently interpreted and visualized the CXR. My interpretation: No pneumonia Radiology interpretation:  IMPRESSION:  No active disease.     PROCEDURES:  Critical Care performed: No    MEDICATIONS ORDERED IN ED: Medications  sodium chloride  0.9 % bolus 1,000 mL (1,000 mLs Intravenous New Bag/Given 09/15/24 2038)     IMPRESSION / MDM / ASSESSMENT AND PLAN / ED COURSE  I reviewed the triage vital signs and the nursing notes.                              Differential diagnosis includes, but is not limited to, DKA, gastroenteritis, cyclic vomiting syndrome  Patient's presentation is most consistent with acute presentation with potential threat to life or bodily function.  Patient presented to the emergency department today because of concerns for nausea vomiting and abdominal pain.  Was recently discharged to the hospital after an admission for DKA.  Patient's blood work here does show slightly elevated glucose.  He does have an anion gap but bicarb is normal.   Additionally VBG shows slight alkalosis.  I do have a lower concern for DKA at this time.  Will give patient IV fluids.  Will give patient Reglan  for symptom control.  Patient did feel significant improvement after the Reglan .  Will check blood work after second liter of fluid.  I do think patient if blood work shows improvement and patient continues to feel better it would be reasonable for discharge. Will prepare paperwork if that turns out to be the case.   FINAL CLINICAL IMPRESSION(S) / ED DIAGNOSES   Final diagnoses:  Nausea and vomiting, unspecified vomiting type     Note:  This document was prepared using Dragon voice recognition software and may include unintentional dictation errors.    Floy Roberts, MD 09/15/24 [phone]

## 2024-09-15 NOTE — ED Triage Notes (Addendum)
 Pt to triage via wheelchair.  Pt reports abd pain and feeling weak.  Pt states vomiting phlegm.  Hx diabetes.  Pt discharged from [facility] 3 days ago.  States not feeling any better.  Pt alert.  Fsbs 217 in triage.

## 2024-09-15 NOTE — ED Provider Notes (Signed)
 ----------------------------------------- 11:40 PM on 09/15/2024 -----------------------------------------  Assuming care from Dr. Floy.  In short, Vincent Morgan is a 33 y.o. male with a chief complaint of vomiting and generalized abdominal pain.  Refer to the original H&P for additional details.  The current plan of care is to reassess after BMP redraw post-IVF.   Clinical Course as of 09/16/24 0448  Wed Sep 16, 2024  0011 Basic metabolic panel(!) No significant change to the patient's anion gap.  I will reassess but anticipate the need for additional treatment. [CF]  0022 I reassessed the patient and his heart rate is 120 at rest.  He said he feels better after the fluids but he was sitting on the trash can next to the sink.  When I got him up to walk he was off balance and had difficulty ambulating without assistance back to the bed.  I ask him what we are missing, why has he gotten more ill after discharge several days ago.  He denies marijuana use, denies drug use, denies alcohol use, says he took his insulin  for a little while and then stopped taking it.  History is vague and he is slow to respond.  However, he said that he has a bad dental infection that he has never told anyone else about.  He showed me in the front upper right part of his mouth where he has poor dentition and extensive areas of redness and what appears to be purulence; when he presses on 1 part of his gums some pus comes out on another part.  He said this has been present for a couple of years but has gotten worse recently and he has never told any other physician about it.  This could be part of the reason his glucose has been hard to control and why he has a mild leukocytosis and tachycardia.  I am ordering DKA treatment per protocol including IV insulin  given his increasingly elevated beta hydroxybutyric acid and anion gap and I ordered a CT maxillofacial with IV contrast to evaluate for the possibility of  facial/odontogenic infection that requires treatment.  Patient will need to be admitted for ongoing DKA management. [CF]  0131 I independently viewed and interpreted the patient's CT maxillofacial and while there are multiple lucencies and dental caries, there is no facial abscess or fistula requiring surgical intervention.  I am consulting the hospitalist team for admission.  I ordered Unasyn 3 g IV to begin empiric antibiotic treatment and he will likely benefit from a course of Augmentin if he can keep it down [CF]    Clinical Course User Index [CF] Gordan Huxley, MD   .Critical Care  Performed by: Gordan Huxley, MD Authorized by: Gordan Huxley, MD   Critical care provider statement:    Critical care time (minutes):  30   Critical care time was exclusive of:  Separately billable procedures and treating other patients   Critical care was necessary to treat or prevent imminent or life-threatening deterioration of the following conditions:  Metabolic crisis   Critical care was time spent personally by me on the following activities:  Development of treatment plan with patient or surrogate, evaluation of patient's response to treatment, examination of patient, obtaining history from patient or surrogate, ordering and performing treatments and interventions, ordering and review of laboratory studies, ordering and review of radiographic studies, pulse oximetry, re-evaluation of patient's condition and review of old charts     Medications  sodium chloride  0.9 % bolus 1,000 mL (0  mLs Intravenous Stopped 09/15/24 2223)  metoCLOPramide  (REGLAN ) injection 10 mg (10 mg Intravenous Given 09/15/24 2127)  sodium chloride  0.9 % bolus 1,000 mL (0 mLs Intravenous Stopped 09/15/24 2335)     ED Discharge Orders     None      Final diagnoses:  Type 1 diabetes mellitus with ketoacidosis without coma (HCC)  Intractable vomiting with nausea  Dental infection     Gordan Huxley, MD 09/16/24 [phone]

## 2024-09-16 ENCOUNTER — Emergency Department

## 2024-09-16 DIAGNOSIS — R112 Nausea with vomiting, unspecified: Secondary | ICD-10-CM

## 2024-09-16 DIAGNOSIS — R109 Unspecified abdominal pain: Secondary | ICD-10-CM | POA: Diagnosis present

## 2024-09-16 DIAGNOSIS — K047 Periapical abscess without sinus: Secondary | ICD-10-CM | POA: Diagnosis present

## 2024-09-16 DIAGNOSIS — F32A Depression, unspecified: Secondary | ICD-10-CM

## 2024-09-16 DIAGNOSIS — Z833 Family history of diabetes mellitus: Secondary | ICD-10-CM | POA: Diagnosis not present

## 2024-09-16 DIAGNOSIS — F17210 Nicotine dependence, cigarettes, uncomplicated: Secondary | ICD-10-CM | POA: Diagnosis present

## 2024-09-16 DIAGNOSIS — R9431 Abnormal electrocardiogram [ECG] [EKG]: Secondary | ICD-10-CM

## 2024-09-16 DIAGNOSIS — E1165 Type 2 diabetes mellitus with hyperglycemia: Secondary | ICD-10-CM

## 2024-09-16 DIAGNOSIS — Z794 Long term (current) use of insulin: Secondary | ICD-10-CM | POA: Diagnosis not present

## 2024-09-16 DIAGNOSIS — Z91199 Patient's noncompliance with other medical treatment and regimen due to unspecified reason: Secondary | ICD-10-CM | POA: Diagnosis not present

## 2024-09-16 DIAGNOSIS — E1010 Type 1 diabetes mellitus with ketoacidosis without coma: Secondary | ICD-10-CM | POA: Diagnosis present

## 2024-09-16 LAB — BETA-HYDROXYBUTYRIC ACID
Beta-Hydroxybutyric Acid: 0.86 mmol/L — ABNORMAL HIGH (ref 0.05–0.27)
Beta-Hydroxybutyric Acid: 0.13 mmol/L (ref 0.05–0.27)
Beta-Hydroxybutyric Acid: 0.12 mmol/L (ref 0.05–0.27)

## 2024-09-16 LAB — BASIC METABOLIC PANEL WITH GFR
Anion gap: 10 (ref 5–15)
Anion gap: 13 (ref 5–15)
Anion gap: 19 — ABNORMAL HIGH (ref 5–15)
Anion gap: 9 (ref 5–15)
BUN: 11 mg/dL (ref 6–20)
BUN: 12 mg/dL (ref 6–20)
BUN: 14 mg/dL (ref 6–20)
BUN: 17 mg/dL (ref 6–20)
CO2: 25 mmol/L (ref 22–32)
CO2: 29 mmol/L (ref 22–32)
CO2: 30 mmol/L (ref 22–32)
CO2: 31 mmol/L (ref 22–32)
Calcium: 8 mg/dL — ABNORMAL LOW (ref 8.9–10.3)
Calcium: 8.3 mg/dL — ABNORMAL LOW (ref 8.9–10.3)
Calcium: 8.3 mg/dL — ABNORMAL LOW (ref 8.9–10.3)
Calcium: 8.5 mg/dL — ABNORMAL LOW (ref 8.9–10.3)
Chloride: 92 mmol/L — ABNORMAL LOW (ref 98–111)
Chloride: 95 mmol/L — ABNORMAL LOW (ref 98–111)
Chloride: 96 mmol/L — ABNORMAL LOW (ref 98–111)
Chloride: 97 mmol/L — ABNORMAL LOW (ref 98–111)
Creatinine, Ser: 0.57 mg/dL — ABNORMAL LOW (ref 0.61–1.24)
Creatinine, Ser: 0.58 mg/dL — ABNORMAL LOW (ref 0.61–1.24)
Creatinine, Ser: 0.64 mg/dL (ref 0.61–1.24)
Creatinine, Ser: 0.74 mg/dL (ref 0.61–1.24)
GFR, Estimated: >60 mL/min (ref >60)
GFR, Estimated: >60 mL/min (ref >60)
GFR, Estimated: >60 mL/min (ref >60)
GFR, Estimated: >60 mL/min (ref >60)
Glucose, Bld: 136 mg/dL — ABNORMAL HIGH (ref 70–99)
Glucose, Bld: 138 mg/dL — ABNORMAL HIGH (ref 70–99)
Glucose, Bld: 214 mg/dL — ABNORMAL HIGH (ref 70–99)
Glucose, Bld: 257 mg/dL — ABNORMAL HIGH (ref 70–99)
Potassium: 3.4 mmol/L — ABNORMAL LOW (ref 3.5–5.1)
Potassium: 3.5 mmol/L (ref 3.5–5.1)
Potassium: 3.6 mmol/L (ref 3.5–5.1)
Potassium: 3.7 mmol/L (ref 3.5–5.1)
Sodium: 135 mmol/L (ref 135–145)
Sodium: 136 mmol/L (ref 135–145)
Sodium: 136 mmol/L (ref 135–145)
Sodium: 139 mmol/L (ref 135–145)

## 2024-09-16 LAB — CBC
HCT: 39.9 % (ref 39.0–52.0)
Hemoglobin: 13.5 g/dL (ref 13.0–17.0)
MCH: 30.8 pg (ref 26.0–34.0)
MCHC: 33.8 g/dL (ref 30.0–36.0)
MCV: 91.1 fL (ref 80.0–100.0)
Platelets: 335 K/uL (ref 150–400)
RBC: 4.38 MIL/uL (ref 4.22–5.81)
RDW: 11.9 % (ref 11.5–15.5)
WBC: 6.6 K/uL (ref 4.0–10.5)
nRBC: 0 % (ref 0.0–0.2)

## 2024-09-16 LAB — GLUCOSE, CAPILLARY
Glucose-Capillary: 197 mg/dL — ABNORMAL HIGH (ref 70–99)
Glucose-Capillary: 172 mg/dL — ABNORMAL HIGH (ref 70–99)
Glucose-Capillary: 161 mg/dL — ABNORMAL HIGH (ref 70–99)
Glucose-Capillary: 191 mg/dL — ABNORMAL HIGH (ref 70–99)
Glucose-Capillary: 169 mg/dL — ABNORMAL HIGH (ref 70–99)
Glucose-Capillary: 127 mg/dL — ABNORMAL HIGH (ref 70–99)
Glucose-Capillary: 162 mg/dL — ABNORMAL HIGH (ref 70–99)
Glucose-Capillary: 208 mg/dL — ABNORMAL HIGH (ref 70–99)
Glucose-Capillary: 226 mg/dL — ABNORMAL HIGH (ref 70–99)
Glucose-Capillary: 161 mg/dL — ABNORMAL HIGH (ref 70–99)
Glucose-Capillary: 213 mg/dL — ABNORMAL HIGH (ref 70–99)
Glucose-Capillary: 120 mg/dL — ABNORMAL HIGH (ref 70–99)

## 2024-09-16 LAB — OSMOLALITY: Osmolality: 297 mosm/kg — ABNORMAL HIGH (ref 275–295)

## 2024-09-16 LAB — CBG MONITORING, ED
Glucose-Capillary: 226 mg/dL — ABNORMAL HIGH (ref 70–99)
Glucose-Capillary: 293 mg/dL — ABNORMAL HIGH (ref 70–99)

## 2024-09-16 LAB — HIV ANTIBODY (ROUTINE TESTING W REFLEX): HIV Screen 4th Generation wRfx: NONREACTIVE

## 2024-09-16 MED ORDER — SODIUM CHLORIDE 0.9 % IV SOLN
3.0000 g | INTRAVENOUS | Status: AC
  Administered 2024-09-16: 3 g via INTRAVENOUS
  Filled 2024-09-16: qty 8

## 2024-09-16 MED ORDER — POTASSIUM CHLORIDE 10 MEQ/100ML IV SOLN
10.0000 meq | INTRAVENOUS | Status: AC
  Filled 2024-09-16 (×2): qty 100

## 2024-09-16 MED ORDER — POTASSIUM CHLORIDE 10 MEQ/100ML IV SOLN
10.0000 meq | INTRAVENOUS | Status: AC
  Administered 2024-09-16 (×2): 10 meq via INTRAVENOUS
  Filled 2024-09-16 (×2): qty 100

## 2024-09-16 MED ORDER — METOCLOPRAMIDE HCL 5 MG/ML IJ SOLN: 10.0000 mg | Freq: Four times a day (QID) | INTRAMUSCULAR | Status: DC | PRN

## 2024-09-16 MED ORDER — INSULIN GLARGINE-YFGN 100 UNIT/ML ~~LOC~~ SOLN
10.0000 [IU] | Freq: Every day | SUBCUTANEOUS | Status: DC
  Administered 2024-09-16 – 2024-09-17 (×2): 10 [IU] via SUBCUTANEOUS
  Filled 2024-09-16 (×2): qty 0.1

## 2024-09-16 MED ORDER — ORAL CARE MOUTH RINSE: 15.0000 mL | OROMUCOSAL | Status: DC | PRN

## 2024-09-16 MED ORDER — ACETAMINOPHEN 650 MG RE SUPP: 650.0000 mg | Freq: Four times a day (QID) | RECTAL | Status: DC | PRN

## 2024-09-16 MED ORDER — POTASSIUM CHLORIDE 10 MEQ/100ML IV SOLN
10.0000 meq | INTRAVENOUS | Status: AC
  Administered 2024-09-16 (×4): 10 meq via INTRAVENOUS
  Filled 2024-09-16 (×4): qty 100

## 2024-09-16 MED ORDER — CHLORHEXIDINE GLUCONATE CLOTH 2 % EX PADS: 6.0000 | MEDICATED_PAD | Freq: Every day | CUTANEOUS | Status: DC

## 2024-09-16 MED ORDER — LACTATED RINGERS IV SOLN: INTRAVENOUS | Status: AC

## 2024-09-16 MED ORDER — ORAL CARE MOUTH RINSE
15.0000 mL | OROMUCOSAL | Status: DC
  Administered 2024-09-16 (×3): 15 mL via OROMUCOSAL

## 2024-09-16 MED ORDER — INSULIN REGULAR(HUMAN) IN NACL 100-0.9 UT/100ML-% IV SOLN: INTRAVENOUS | Status: DC

## 2024-09-16 MED ORDER — DEXTROSE IN LACTATED RINGERS 5 % IV SOLN: INTRAVENOUS | Status: AC

## 2024-09-16 MED ORDER — ACETAMINOPHEN 325 MG PO TABS
650.0000 mg | ORAL_TABLET | Freq: Four times a day (QID) | ORAL | Status: DC | PRN
Start: 2024-09-16 — End: 2024-09-17

## 2024-09-16 MED ORDER — LACTATED RINGERS IV BOLUS: 20.0000 mL/kg | Freq: Once | INTRAVENOUS | Status: AC

## 2024-09-16 MED ORDER — TRAZODONE HCL 50 MG PO TABS: 25.0000 mg | ORAL_TABLET | Freq: Every evening | ORAL | Status: DC | PRN

## 2024-09-16 MED ORDER — IOHEXOL 300 MG/ML  SOLN
75.0000 mL | Freq: Once | INTRAMUSCULAR | Status: AC | PRN
  Administered 2024-09-16: 75 mL via INTRAVENOUS

## 2024-09-16 MED ORDER — ORAL CARE MOUTH RINSE
15.0000 mL | Freq: Two times a day (BID) | OROMUCOSAL | Status: DC
  Administered 2024-09-17: 15 mL via OROMUCOSAL

## 2024-09-16 MED ORDER — INSULIN ASPART 100 UNIT/ML IJ SOLN: 0.0000 [IU] | Freq: Every day | INTRAMUSCULAR | Status: DC

## 2024-09-16 MED ORDER — POTASSIUM CHLORIDE CRYS ER 20 MEQ PO TBCR
40.0000 meq | EXTENDED_RELEASE_TABLET | Freq: Once | ORAL | Status: AC
  Administered 2024-09-16: 40 meq via ORAL
  Filled 2024-09-16: qty 2

## 2024-09-16 MED ORDER — INSULIN REGULAR(HUMAN) IN NACL 100-0.9 UT/100ML-% IV SOLN
INTRAVENOUS | Status: DC
  Administered 2024-09-16: 8.5 [IU]/h via INTRAVENOUS
  Filled 2024-09-16: qty 100

## 2024-09-16 MED ORDER — DEXTROSE 50 % IV SOLN: 0.0000 mL | INTRAVENOUS | Status: DC | PRN

## 2024-09-16 MED ORDER — ENOXAPARIN SODIUM 40 MG/0.4ML IJ SOSY
40.0000 mg | PREFILLED_SYRINGE | INTRAMUSCULAR | Status: DC
  Administered 2024-09-16: 40 mg via SUBCUTANEOUS
  Filled 2024-09-16 (×2): qty 0.4

## 2024-09-16 MED ORDER — INSULIN ASPART 100 UNIT/ML IJ SOLN
0.0000 [IU] | Freq: Three times a day (TID) | INTRAMUSCULAR | Status: DC
  Administered 2024-09-16: 3 [IU] via SUBCUTANEOUS
  Administered 2024-09-16 – 2024-09-17 (×2): 2 [IU] via SUBCUTANEOUS
  Filled 2024-09-16 (×3): qty 1

## 2024-09-16 MED ORDER — MAGNESIUM HYDROXIDE 400 MG/5ML PO SUSP
30.0000 mL | Freq: Every day | ORAL | Status: DC | PRN
  Administered 2024-09-17: 30 mL via ORAL
  Filled 2024-09-16: qty 30

## 2024-09-16 NOTE — Assessment & Plan Note (Signed)
-   Will continue Remeron.

## 2024-09-16 NOTE — Assessment & Plan Note (Signed)
 Would avoid QT prolonging agents.

## 2024-09-16 NOTE — Consult Note (Addendum)
 PHARMACY CONSULT NOTE - ELECTROLYTES  Pharmacy Consult for Electrolyte Monitoring and Replacement   Recent Labs: Height: 6' 2 (188 cm) Weight: 56.5 kg (124 lb 9 oz) IBW/kg (Calculated) : 82.2 Estimated Creatinine Clearance: 104 mL/min (A) (by C-G formula based on SCr of 0.57 mg/dL (L)). Potassium (mmol/L)  Date Value  09/16/2024 3.6  12/15/2014 4.4   Magnesium  (mg/dL)  Date Value  88/97/7974 2.0   Calcium (mg/dL)  Date Value  88/94/7974 8.0 (L)   Calcium, Total (mg/dL)  Date Value  97/96/7983 8.9   Albumin (g/dL)  Date Value  88/95/7974 3.5  12/15/2014 3.9   Phosphorus (mg/dL)  Date Value  88/97/7974 2.3 (L)   Sodium (mmol/L)  Date Value  09/16/2024 136  12/15/2014 136   Assessment  Vincent Morgan is a 33 y.o. male presenting with acute onset of recurrent nausea and vomiting. PMH significant for DM, prolonged QT interval, cannabinoid hyperemesis syndrome and cyclical vomiting. Pharmacy has been consulted to monitor and replace electrolytes.  Diet: Carb modified Pertinent medications: insulin  gtt resumed overnight  MIVF: D5LR 125 ml/hr, insulin  gtt  Goal of Therapy: K 4-5; Other electrolytes WNL  Plan:  K 3.6 @ 0755 > ordered 40 mEq Kcl po tabs Monitor electrolytes q4h  Thank you for allowing pharmacy to be a part of this patient's care.  Vincent Morgan Vincent Morgan 09/16/2024 11:22 AM

## 2024-09-16 NOTE — Plan of Care (Signed)
  Problem: Cardiac: Goal: Ability to maintain an adequate cardiac output will improve Outcome: Progressing   Problem: Health Behavior/Discharge Planning: Goal: Ability to identify and utilize available resources and services will improve Outcome: Progressing Goal: Ability to manage health-related needs will improve Outcome: Progressing   Problem: Fluid Volume: Goal: Ability to achieve a balanced intake and output will improve Outcome: Progressing   Problem: Metabolic: Goal: Ability to maintain appropriate glucose levels will improve Outcome: Progressing   Problem: Nutritional: Goal: Maintenance of adequate nutrition will improve Outcome: Not Progressing Goal: Maintenance of adequate weight for body size and type will improve Outcome: Not Progressing   Problem: Respiratory: Goal: Will regain and/or maintain adequate ventilation Outcome: Progressing

## 2024-09-16 NOTE — Assessment & Plan Note (Signed)
-   This associated with metastatic hyperglycemia. - The patient will be admitted to a stepdown bed. - We will continue the on IV insulin  drip per [facility] protocol. - The patient will be aggressively hydrated with IV LR. - Will follow serial BMPs.

## 2024-09-16 NOTE — Inpatient Diabetes Management (Addendum)
 Inpatient Diabetes Program Recommendations  AACE/ADA: New Consensus Statement on Inpatient Glycemic Control (2015)  Target Ranges:  Prepandial:   less than 140 mg/dL      Peak postprandial:   less than 180 mg/dL (1-2 hours)      Critically ill patients:  140 - 180 mg/dL    Latest Reference Range & Units 09/12/24 05:02  Hemoglobin A1C 4.8 - 5.6 % >15.5 (H)  >398 mg/dl  (H): Data is abnormally high  Latest Reference Range & Units 09/15/24 20:09 09/16/24 03:32  Beta-Hydroxybutyric Acid 0.05 - 0.27 mmol/L 5.29 (H) 0.86 (H)  (H): Data is abnormally high  Latest Reference Range & Units 09/15/24 17:50 09/16/24 00:46 09/16/24 02:01 09/16/24 03:50 09/16/24 04:52 09/16/24 05:54 09/16/24 06:50  Glucose-Capillary 70 - 99 mg/dL 782 (H) 706 (H)  IV Insulin  Drip Started 226 (H) 172 (H) 161 (H) 191 (H) 169 (H)  (H): Data is abnormally high    Admit with: Hyperglycemia/ Abd Pain/ N&V  History: Type 1 diabetes, Cannabinoid hyperemesis syndrome, Cyclical vomiting syndrome,   Home DM Meds: Novolog  Mix 70/30 Insulin  10 units BID  Current Orders: IV Insulin  Drip    Was just admitted 09/11/2024 thru 09/13/2024 for DKA Was counseled by the Diabetes RN 09/11/2024   Note orders placed for transition to SQ Insulin  this AM:  Semglee  10 units daily Novolog  Sensitive Correction Scale/ SSI (0-9 units) TID AC + HS   Addendum 11:10am--Met w/ pt today--He had the covers over his head but eventually uncovered his head so we could talk.  I was frank with him and told him he needs to stop smoking marijuana and take his insulin !!  We discussed how marijuana and uncontrolled CBGs can cause gastric slowing and N&V.  Pt stated to me he has insulin  and supplies at home.  Pt states his Medicaid is current.  Has CBG meter and states he checks CBGs BID before taking insulin  but was unaware that his A1c was high.  I told pt I was confused as to why he was unaware his A1c was high b/c if he was checking his CBGs at home  he would see that they were chronically elevated--Reviewed with pt again healthy/goal CBGs and A1c for home.       --Will follow patient during hospitalization--  Adina Rudolpho Arrow RN, MSN, CDCES Diabetes Coordinator Inpatient Glycemic Control Team Team Pager: [phone] (8a-5p)

## 2024-09-16 NOTE — Consult Note (Signed)
 PHARMACY CONSULT NOTE - ELECTROLYTES  Pharmacy Consult for Electrolyte Monitoring and Replacement   Recent Labs: Height: 6' 2 (188 cm) Weight: 56.5 kg (124 lb 9 oz) IBW/kg (Calculated) : 82.2 Estimated Creatinine Clearance: 104 mL/min (A) (by C-G formula based on SCr of 0.57 mg/dL (L)). Potassium (mmol/L)  Date Value  09/16/2024 3.6  12/15/2014 4.4   Magnesium  (mg/dL)  Date Value  88/97/7974 2.0   Calcium (mg/dL)  Date Value  88/94/7974 8.0 (L)   Calcium, Total (mg/dL)  Date Value  97/96/7983 8.9   Albumin (g/dL)  Date Value  88/95/7974 3.5  12/15/2014 3.9   Phosphorus (mg/dL)  Date Value  88/97/7974 2.3 (L)   Sodium (mmol/L)  Date Value  09/16/2024 136  12/15/2014 136   Assessment  Vincent Morgan is a 33 y.o. male presenting with acute onset of recurrent nausea and vomiting. PMH significant for DM, prolonged QT interval, cannabinoid hyperemesis syndrome and cyclical vomiting. Pharmacy has been consulted to monitor and replace electrolytes.  Diet: Carb modified Pertinent medications: insulin  gtt resumed overnight  MIVF: D5LR 125 ml/hr, insulin  gtt  Goal of Therapy: K 4-5 (insulin  gtt); Other electrolytes WNL  K 3.7 @ 11:30 - 40 mEq Kcl tabs given at 1124  Plan:  Reassess electrolytes in 4 hours  Thank you for allowing pharmacy to be a part of this patient's care.  Leonor BROCKS Riah Kehoe 09/16/2024 11:55 AM

## 2024-09-16 NOTE — H&P (Addendum)
 [place]   PATIENT NAME: Vincent Morgan    MR#:  [id number]  DATE OF BIRTH:  08/20/1991  DATE OF ADMISSION:  09/15/2024  PRIMARY CARE PHYSICIAN: [employer]   Patient is coming from: Home  REQUESTING/REFERRING PHYSICIAN: Gordan Huxley, MD  CHIEF COMPLAINT:   Chief Complaint  Patient presents with   Abdominal Pain    HISTORY OF PRESENT ILLNESS:  Vincent Morgan is a 33 y.o. African-American male with medical history significant for diabetes mellitus, with noncompliance, as well as prolonged QT interval and cannabinoid hyperemesis syndrome, cyclical vomiting syndrome, who presented to the emergency room with acute onset of recurrent nausea and vomiting over the last couple of days. No bilious vomitus or hematemesis. He has been having generalized abdominal pain mainly in the lower abdomen. No diarrhea or melena or bright red bleeding per rectum. No chest pain or palpitations. No cough or wheezing or dyspnea. No fever or chills.  Patient was just admitted for DKA and discharged from ER on 07/23/2024.  ED Course: When the patient came to the ER labs revealed borderline potassium of 3.5 and blood glucose of 257 with anion gap of 19 and CO2 of 25.  CBC was within normal. EKG as reviewed by me : None Imaging: Portable chest x-ray showed no acute cardiopulmonary disease.  CT maxillofacial revealed the following:  Facial CT showed multiple maxillary dental caries and right lateral incisor periapical lucency with no focal edema or drainable fluid collection.  The patient was started on IV insulin  per Endo tool. He will be admitted to an observation stepdown unit bed for further evaluation and management.  PAST MEDICAL HISTORY:   Past Medical History:  Diagnosis Date   Diabetes mellitus without complication (HCC)     PAST SURGICAL HISTORY:  History reviewed. No pertinent surgical history.  SOCIAL HISTORY:   Social History   Tobacco Use   Smoking status: Every  Day    Current packs/day: 0.25    Types: Cigarettes   Smokeless tobacco: Never  Substance Use Topics   Alcohol use: Never    FAMILY HISTORY:   Positive for diabetes mellitus.  DRUG ALLERGIES:  No Known Allergies  REVIEW OF SYSTEMS:   ROS As per history of present illness. All pertinent systems were reviewed above. Constitutional, HEENT, cardiovascular, respiratory, GI, GU, musculoskeletal, neuro, psychiatric, endocrine, integumentary and hematologic systems were reviewed and are otherwise negative/unremarkable except for positive findings mentioned above in the HPI.   MEDICATIONS AT HOME:   Prior to Admission medications   Medication Sig Start Date End Date Taking? Authorizing Provider  insulin  aspart protamine- aspart (NOVOLOG  MIX 70/30) (70-30) 100 UNIT/ML injection Inject 0.1 mLs (10 Units total) into the skin 2 (two) times daily with a meal. 09/13/24 11/12/24 Yes Jens Durand, MD      VITAL SIGNS:  Blood pressure 97/71, pulse 97, temperature 98.9 F (37.2 C), temperature source Oral, resp. rate 15, height 6' 2 (1.88 m), weight 56.5 kg, SpO2 100%.  PHYSICAL EXAMINATION:  Physical Exam  GENERAL:  33 y.o.-year-old patient lying in the bed with no acute distress.  EYES: Pupils equal, round, reactive to light and accommodation. No scleral icterus. Extraocular muscles intact.  HEENT: Head atraumatic, normocephalic. Oropharynx with dry mucous membrane and tongue and nasopharynx clear.  NECK:  Supple, no jugular venous distention. No thyroid enlargement, no tenderness.  LUNGS: Normal breath sounds bilaterally, no wheezing, rales,rhonchi or crepitation. No use of accessory muscles of respiration.  CARDIOVASCULAR: Regular rate and rhythm, S1, S2 normal. No murmurs, rubs, or gallops.  ABDOMEN: Soft, nondistended, nontender. Bowel sounds present. No organomegaly or mass.  EXTREMITIES: No pedal edema, cyanosis, or clubbing.  NEUROLOGIC: Cranial nerves II through XII are intact.  Muscle strength 5/5 in all extremities. Sensation intact. Gait not checked.  PSYCHIATRIC: The patient is alert and oriented x 3.  Normal affect and good eye contact. SKIN: No obvious rash, lesion, or ulcer.   LABORATORY PANEL:   CBC Recent Labs  Lab 09/16/24 0323  WBC 6.6  HGB 13.5  HCT 39.9  PLT 335   ------------------------------------------------------------------------------------------------------------------  Chemistries  Recent Labs  Lab 09/13/24 0924 09/15/24 1818 09/15/24 2341 09/16/24 0323  NA 137 133*   < > 139  K 3.9 3.5   < > 3.4*  CL 90* 86*   < > 95*  CO2 32 28   < > 31  GLUCOSE 250* 222*   < > 136*  BUN 18 18   < > 14  CREATININE 0.67 0.84   < > 0.64  CALCIUM 9.2 9.4   < > 8.5*  MG 2.0  --   --   --   AST  --  23  --   --   ALT  --  25  --   --   ALKPHOS  --  88  --   --   BILITOT  --  1.6*  --   --    < > = values in this interval not displayed.   ------------------------------------------------------------------------------------------------------------------  Cardiac Enzymes No results for input(s): TROPONINI in the last 168 hours. ------------------------------------------------------------------------------------------------------------------  RADIOLOGY:  CT Maxillofacial W Contrast Result Date: 09/16/2024 EXAM: CT Face with contrast 09/16/2024 12:44:13 AM TECHNIQUE: CT of the face was performed with the administration of intravenous contrast. Multiplanar reformatted images are provided for review. Automated exposure control, iterative reconstruction, and/or weight based adjustment of the mA/kV was utilized to reduce the radiation dose to as low as reasonably achievable. 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 FINDINGS: AERODIGESTIVE TRACT: No mass. No edema. SALIVARY GLANDS: No acute abnormality. LYMPH NODES: No suspicious cervical lymphadenopathy. SOFT TISSUES: No mass or fluid  collection. BRAIN, ORBITS AND SINUSES: No acute abnormality. BONES: No acute abnormality. Dental: Multiple maxillary dental caries. Right lateral incisor periapical lucency. IMPRESSION: 1. Multiple maxillary dental caries and right lateral incisor periapical lucency. No visible focal edema or drainable fluid collection. Electronically signed by: Gilmore Molt MD 09/16/2024 01:01 AM EST RP Workstation: [id number]   DG Chest Portable 1 View Result Date: 09/15/2024 CLINICAL DATA:  Cough. EXAM: PORTABLE CHEST 1 VIEW COMPARISON:  September 11, 2024 FINDINGS: The heart size and mediastinal contours are within normal limits. Both lungs are clear. The visualized skeletal structures are unremarkable. IMPRESSION: No active disease. Electronically Signed   By: Suzen Dials M.D.   On: 09/15/2024 21:17      IMPRESSION AND PLAN:  Assessment and Plan: * Uncontrolled type 2 diabetes mellitus with hyperglycemia (HCC) - This associated with metastatic hyperglycemia. - The patient will be admitted to a stepdown bed. - We will continue the on IV insulin  drip per EndoTool [facility] protocol. - The patient will be aggressively hydrated with IV LR. - Will follow serial BMPs.    Intractable nausea and vomiting - This could be related to with acute gastritis - IV PPI therapy will be provided.  Prolonged QT interval Would avoid QT prolonging agents.  Depression - Will continue  Remeron.       DVT prophylaxis: Lovenox.  Advanced Care Planning:  Code Status: full code.  Family Communication:  The plan of care was discussed in details with the patient (and family). I answered all questions. The patient agreed to proceed with the above mentioned plan. Further management will depend upon hospital course. Disposition Plan: Back to previous home environment Consults called: none.  All the records are reviewed and case discussed with ED provider.  Status is: Observation   At the time of the admission, it  appears that the appropriate admission status for this patient is inpatient.  This is judged to be reasonable and necessary in order to provide the required intensity of service to ensure the patient's safety given the presenting symptoms, physical exam findings and initial radiographic and laboratory data in the context of comorbid conditions.  The patient requires inpatient status due to high intensity of service, high risk of further deterioration and high frequency of surveillance required.  I certify that at the time of admission, it is my clinical judgment that the patient will require inpatient hospital care extending more than 2 midnights.                            Dispo: The patient is from: Home              Anticipated d/c is to: Home              Patient currently is not medically stable to d/c.              Difficult to place patient: No  Madison DELENA Peaches M.D on 09/16/2024 at 4:20 AM  [facility]   From 7 PM-7 AM, contact night-coverage [email or web address]  CC: Primary care physician; [facility], [employer]

## 2024-09-16 NOTE — Assessment & Plan Note (Signed)
-   This could be related to with acute gastritis - IV PPI therapy will be provided.

## 2024-09-16 NOTE — Progress Notes (Signed)
 Progress Note   Patient: Vincent Morgan FMW:[id number] DOB: 1991/02/14 DOA: 09/15/2024     0 DOS: the patient was seen and examined on 09/16/2024   Brief hospital course: From HPI Vincent Morgan is a 33 y.o. African-American male with medical history significant for diabetes mellitus, with noncompliance, as well as prolonged QT interval and cannabinoid hyperemesis syndrome, cyclical vomiting syndrome, who presented to the emergency room with acute onset of recurrent nausea and vomiting over the last couple of days. No bilious vomitus or hematemesis. He has been having generalized abdominal pain mainly in the lower abdomen. No diarrhea or melena or bright red bleeding per rectum. No chest pain or palpitations. No cough or wheezing or dyspnea. No fever or chills.  Patient was just admitted for DKA and discharged from ER on 07/23/2024.   ED Course: When the patient came to the ER labs revealed borderline potassium of 3.5 and blood glucose of 257 with anion gap of 19 and CO2 of 25.  CBC was within normal. EKG as reviewed by me : None Imaging: Portable chest x-ray showed no acute cardiopulmonary disease.  CT maxillofacial revealed the following:   Facial CT showed multiple maxillary dental caries and right lateral incisor periapical lucency with no focal edema or drainable fluid collection.    Assessment and Plan:   Uncontrolled type 2 diabetes mellitus with hyperglycemia (HCC) - This associated with metastatic hyperglycemia. - Patient is now off insulin  drip Placed on subcutaneous insulin  -Counseled on adequate hydration Case discussed with diabetic coordinator We will transfer the patient from the ICU to the floor today We will monitor glucose level closely to ensure normal range before discharge hopefully tomorrow  Intractable nausea and vomiting-resolved - This could be related to with acute gastritis - IV PPI therapy   Prolonged QT interval Would avoid QT prolonging agents.    Depression - Will continue Remeron.  DVT prophylaxis: Lovenox.  Advanced Care Planning:  Code Status: full code.  Family Communication: Discussed with patient  Subjective:  Patient seen and examined at bedside this morning I spoke to him extensively about compliance with his diet and medication He denies nausea vomiting abdominal pain or chest pain  Physical Exam: GENERAL: Young male laying in bed in no distress LUNGS: Clear to auscultation bilaterally CARDIOVASCULAR: Regular rate and rhythm, S1, S2 normal. No murmurs, rubs, or gallops.  ABDOMEN: Soft, nondistended, nontender. Bowel sounds present. No organomegaly or mass.  EXTREMITIES: No pedal edema, cyanosis, or clubbing.  NEUROLOGIC: Cranial nerves II through XII are intact. Muscle strength 5/5 in all extremities. Sensation intact. Gait not checked.  PSYCHIATRIC: The patient is alert and oriented x 3.  Normal affect and good eye contact. SKIN: No obvious rash, lesion, or ulcer.  Vitals:   09/16/24 1400 09/16/24 1500 09/16/24 1615 09/16/24 1641  BP: 104/70 92/61 101/68 110/78  Pulse: 97 99 93 88  Resp: (!) 24 (!) 22 (!) 9 16  Temp:    99 F (37.2 C)  TempSrc:    Oral  SpO2: 98% 97% 99% 100%  Weight:      Height:        Data Reviewed:    Latest Ref Rng & Units 09/16/2024    3:23 AM 09/15/2024    6:18 PM 09/10/2024   11:36 PM  CBC  WBC 4.0 - 10.5 K/uL 6.6  11.5  29.7   Hemoglobin 13.0 - 17.0 g/dL 86.4  83.6  84.3   Hematocrit 39.0 - 52.0 % 39.9  48.0  45.4   Platelets 150 - 400 K/uL 335  406  382        Latest Ref Rng & Units 09/16/2024   11:30 AM 09/16/2024    7:55 AM 09/16/2024    3:23 AM  BMP  Glucose 70 - 99 mg/dL 785  861  863   BUN 6 - 20 mg/dL 11  12  14    Creatinine 0.61 - 1.24 mg/dL 9.41  9.42  9.35   Sodium 135 - 145 mmol/L 135  136  139   Potassium 3.5 - 5.1 mmol/L 3.7  3.6  3.4   Chloride 98 - 111 mmol/L 97  96  95   CO2 22 - 32 mmol/L 29  30  31    Calcium 8.9 - 10.3 mg/dL 8.3  8.0  8.5       Disposition: Status is: Inpatient  Time spent: 52 minutes  Author: Drue ONEIDA Potter, MD 09/16/2024 6:32 PM  For on call review [email or web address].

## 2024-09-16 NOTE — TOC Initial Note (Signed)
 Transition of Care [facility]) - Initial/Assessment Note    Patient Details  Name: Vincent Morgan MRN: [id number] Date of Birth: 1990/11/28  [facility] CM/SW Contact:    Corrie JINNY Ruts, LCSW Phone Number: 09/16/2024, 1:44 PM  Clinical Narrative:                 Chart reviewed. The patient was admitted for uncontrolled type 2 diabetes mellitus w/ hyperglycemia. I was able to speak with the patient at bedside today. I introduced myself, my role, and reason for consult. The patient confirms that he has a PCP. The patient reports that he lives in the home with his mother. The patient reports that he needed assistance completing daily living task. The patient reports that his mother drives him to medical appointment and she will assist during D/C.   The patient reports that he uses psychologist, forensic. The patient reports that he never had HH or been admitted into a SNF in the past. The patient reports that he has no equipment in the home. The patient reports that he has no questions or concerns during the assessment.   There are no TOC needs at this time.         Patient Goals and CMS Choice            Expected Discharge Plan and Services                                              Prior Living Arrangements/Services                       Activities of Daily Living      Permission Sought/Granted                  Emotional Assessment              Admission diagnosis:  Dental infection [K04.7] DKA, type 1 (HCC) [E10.10] Intractable vomiting with nausea [R11.2] Type 1 diabetes mellitus with ketoacidosis without coma (HCC) [E10.10] Patient Active Problem List   Diagnosis Date Noted   Uncontrolled type 2 diabetes mellitus with hyperglycemia (HCC) 09/16/2024   Depression 09/16/2024   Intractable nausea and vomiting 09/11/2024   Hypokalemia 09/11/2024   Nausea and vomiting 03/16/2023   DKA (diabetic ketoacidosis) (HCC) 03/16/2023   AKI  (acute kidney injury) 03/16/2023   Electrolyte abnormality 03/16/2023   Prolonged QT interval 03/16/2023   PCP:  [employer] Pharmacy:   [facility] [address] [place], [place] - [address] [place] [address] Phone: [phone] Fax: [phone]  [facility] - [place], [place] - [address] [place] [address] Phone: [phone] Fax: [phone]     Social Drivers of Health (SDOH) Social History: SDOH Screenings   Food Insecurity: Food Insecurity Present (09/16/2024)  Housing: Low Risk  (09/16/2024)  Transportation Needs: No Transportation Needs (09/16/2024)  Utilities: Not At Risk (09/16/2024)  Financial Resource Strain: Low Risk  (03/20/2023)   Received from [facility]  Tobacco Use: High Risk (09/15/2024)   SDOH Interventions:     Readmission Risk Interventions     No data to display

## 2024-09-17 DIAGNOSIS — E1165 Type 2 diabetes mellitus with hyperglycemia: Secondary | ICD-10-CM | POA: Diagnosis not present

## 2024-09-17 LAB — BASIC METABOLIC PANEL WITH GFR
Anion gap: 10 (ref 5–15)
BUN: 10 mg/dL (ref 6–20)
CO2: 27 mmol/L (ref 22–32)
Calcium: 8.3 mg/dL — ABNORMAL LOW (ref 8.9–10.3)
Chloride: 99 mmol/L (ref 98–111)
Creatinine, Ser: 0.57 mg/dL — ABNORMAL LOW (ref 0.61–1.24)
GFR, Estimated: >60 mL/min (ref >60)
Glucose, Bld: 216 mg/dL — ABNORMAL HIGH (ref 70–99)
Potassium: 3.6 mmol/L (ref 3.5–5.1)
Sodium: 136 mmol/L (ref 135–145)

## 2024-09-17 LAB — GLUCOSE, CAPILLARY: Glucose-Capillary: 171 mg/dL — ABNORMAL HIGH (ref 70–99)

## 2024-09-17 LAB — MAGNESIUM: Magnesium: 1.9 mg/dL (ref 1.7–2.4)

## 2024-09-17 NOTE — Progress Notes (Signed)
 This writer went in room finding shower running, telemetry box off, with pt belongings gone.  Patient left before this writer provided discharge instructions. Missing person alert called.   Both IV lines found in trash by security; pt not found.  Provider notified.

## 2024-09-17 NOTE — Discharge Summary (Signed)
 Physician Discharge Summary   Patient: Vincent Morgan MRN: [id number] DOB: 1991-05-21  Admit date:     09/15/2024  Discharge date: 09/17/24  Discharge Physician: Drue ONEIDA Potter   PCP: [facility]   Recommendations at discharge:  Counseled on medication compliance Also counseled on diabetic diet  Discharge Diagnoses: Principal Problem:   Uncontrolled type 2 diabetes mellitus with hyperglycemia (HCC) Active Problems:   Intractable nausea and vomiting   Prolonged QT interval   Depression   DKA, type 1 (HCC)  Resolved Problems:   * No resolved hospital problems. [facility] Course: From HPI Vincent Morgan is a 33 y.o. African-American male with medical history significant for diabetes mellitus, with noncompliance, as well as prolonged QT interval and cannabinoid hyperemesis syndrome, cyclical vomiting syndrome, who presented to the emergency room with acute onset of recurrent nausea and vomiting over the last couple of days. No bilious vomitus or hematemesis. He has been having generalized abdominal pain mainly in the lower abdomen. No diarrhea or melena or bright red bleeding per rectum. No chest pain or palpitations. No cough or wheezing or dyspnea. No fever or chills.  Patient was just admitted for DKA and discharged from ER on 07/23/2024.   ED Course: When the patient came to the ER labs revealed borderline potassium of 3.5 and blood glucose of 257 with anion gap of 19 and CO2 of 25.  CBC was within normal. EKG as reviewed by me : None Imaging: Portable chest x-ray showed no acute cardiopulmonary disease.  CT maxillofacial revealed the following:   Facial CT showed multiple maxillary dental caries and right lateral incisor periapical lucency with no focal edema or drainable fluid collection.     Assessment and Plan:    Uncontrolled type 2 diabetes mellitus with hyperglycemia (HCC) Glucose level better controlled Patient will be discharged today on home insulin   therapy   Intractable nausea and vomiting-resolved - This could be related to with acute gastritis Improved   Prolonged QT interval Would avoid QT prolonging agents.   Depression - Will continue Remeron.  Diet recommendation:  Carb modified diet DISCHARGE MEDICATION: Allergies as of 09/17/2024   No Known Allergies      Medication List     TAKE these medications    insulin  aspart protamine- aspart (70-30) 100 UNIT/ML injection Commonly known as: NOVOLOG  MIX 70/30 Inject 0.1 mLs (10 Units total) into the skin 2 (two) times daily with a meal.        Discharge Exam: Filed Weights   09/15/24 1749 09/16/24 0309  Weight: 59 kg 56.5 kg   GENERAL: Young male laying in bed in no distress LUNGS: Clear to auscultation bilaterally CARDIOVASCULAR: Regular rate and rhythm, S1, S2 normal. No murmurs, rubs, or gallops.  ABDOMEN: Soft, nondistended, nontender. Bowel sounds present. No organomegaly or mass.  EXTREMITIES: No pedal edema, cyanosis, or clubbing.  NEUROLOGIC: Cranial nerves II through XII are intact. Muscle strength 5/5 in all extremities. Sensation intact. Gait not checked.  PSYCHIATRIC: The patient is alert and oriented x 3.  Normal affect and good eye contact. SKIN: No obvious rash, lesion, or ulcer.  Condition at discharge: good  The results of significant diagnostics from this hospitalization (including imaging, microbiology, ancillary and laboratory) are listed below for reference.   Imaging Studies: CT Maxillofacial W Contrast Result Date: 09/16/2024 EXAM: CT Face with contrast 09/16/2024 12:44:13 AM TECHNIQUE: CT of the face was performed with the administration of intravenous contrast. Multiplanar reformatted images are  provided for review. Automated exposure control, iterative reconstruction, and/or weight based adjustment of the mA/kV was utilized to reduce the radiation dose to as low as reasonably achievable. 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 FINDINGS: AERODIGESTIVE TRACT: No mass. No edema. SALIVARY GLANDS: No acute abnormality. LYMPH NODES: No suspicious cervical lymphadenopathy. SOFT TISSUES: No mass or fluid collection. BRAIN, ORBITS AND SINUSES: No acute abnormality. BONES: No acute abnormality. Dental: Multiple maxillary dental caries. Right lateral incisor periapical lucency. IMPRESSION: 1. Multiple maxillary dental caries and right lateral incisor periapical lucency. No visible focal edema or drainable fluid collection. Electronically signed by: Gilmore Molt MD 09/16/2024 01:01 AM EST RP Workstation: [id number]   DG Chest Portable 1 View Result Date: 09/15/2024 CLINICAL DATA:  Cough. EXAM: PORTABLE CHEST 1 VIEW COMPARISON:  September 11, 2024 FINDINGS: The heart size and mediastinal contours are within normal limits. Both lungs are clear. The visualized skeletal structures are unremarkable. IMPRESSION: No active disease. Electronically Signed   By: Suzen Dials M.D.   On: 09/15/2024 21:17   DG Chest Portable 1 View Result Date: 09/11/2024 EXAM: 1 VIEW(S) XRAY OF THE CHEST 09/11/2024 01:49:13 AM COMPARISON: Chest x-ray 04/09/2011. CLINICAL HISTORY: vomiting, leukocytosis, evaluate for possible aspiration FINDINGS: LUNGS AND PLEURA: No focal pulmonary opacity. No pulmonary edema. No pleural effusion. No pneumothorax. HEART AND MEDIASTINUM: No acute abnormality of the cardiac and mediastinal silhouettes. BONES AND SOFT TISSUES: No acute osseous abnormality. IMPRESSION: 1. No acute process. Electronically signed by: Greig Pique MD 09/11/2024 02:00 AM EDT RP Workstation: [id number]    Microbiology: Results for orders placed or performed during the hospital encounter of 09/10/24  MRSA Next Gen by PCR, Nasal     Status: None   Collection Time: 09/11/24 10:26 AM   Specimen: Nasal Mucosa; Nasal Swab  Result Value Ref Range Status   MRSA by PCR Next Gen NOT DETECTED NOT DETECTED  Final    Comment: (NOTE) The GeneXpert MRSA Assay (FDA approved for NASAL specimens only), is one component of a comprehensive MRSA colonization surveillance program. It is not intended to diagnose MRSA infection nor to guide or monitor treatment for MRSA infections. Test performance is not FDA approved in patients less than 70 years old. Performed at [facility], [address] [place], [place] [address]     Labs: CBC: Recent Labs  Lab 09/10/24 2336 09/15/24 1818 09/16/24 0323  WBC 29.7* 11.5* 6.6  NEUTROABS 25.9*  --   --   HGB 15.6 16.3 13.5  HCT 45.4 48.0 39.9  MCV 91.0 91.1 91.1  PLT 382 406* 335   Basic Metabolic Panel: Recent Labs  Lab 09/10/24 2336 09/11/24 0433 09/12/24 0502 09/13/24 0924 09/15/24 1818 09/15/24 2341 09/16/24 0323 09/16/24 0755 09/16/24 1130 09/17/24 0539  NA 132*   < >  --  137   < > 136 139 136 135 136  K 3.1*   < >  --  3.9   < > 3.5 3.4* 3.6 3.7 3.6  CL 73*   < >  --  90*   < > 92* 95* 96* 97* 99  CO2 17*   < >  --  32   < > 25 31 30 29 27   GLUCOSE 834*   < >  --  250*   < > 257* 136* 138* 214* 216*  BUN 47*   < >  --  18   < > 17 14 12 11 10   CREATININE 2.01*   < >  --  0.67   < > 0.74 0.64 0.57* 0.58* 0.57*  CALCIUM 9.1   < >  --  9.2   < > 8.3* 8.5* 8.0* 8.3* 8.3*  MG 3.0*  --  2.5* 2.0  --   --   --   --   --  1.9  PHOS  --   --  1.8* 2.3*  --   --   --   --   --   --    < > = values in this interval not displayed.   Liver Function Tests: Recent Labs  Lab 09/10/24 2336 09/13/24 0924 09/15/24 1818  AST 64*  --  23  ALT 34  --  25  ALKPHOS 127*  --  88  BILITOT 3.4*  --  1.6*  PROT 8.5*  --  7.7  ALBUMIN 4.1 3.2* 3.5   CBG: Recent Labs  Lab 09/16/24 1127 09/16/24 1215 09/16/24 1626 09/16/24 2139 09/17/24 0749  GLUCAP 226* 161* 213* 120* 171*    Discharge time spent:  38 minutes.  Signed: Drue ONEIDA Potter, MD [facility] 09/17/2024

## 2024-09-17 NOTE — Plan of Care (Signed)
  Problem: Education: Goal: Knowledge of General Education information will improve Description Including pain rating scale, medication(s)/side effects and non-pharmacologic comfort measures Outcome: Not Met (add Reason)
